# Patient Record
Sex: Male | Born: 1955 | Race: White | Hispanic: Yes | Smoking: Never smoker
Health system: Southern US, Community
[De-identification: ages and names within clinical notes are randomized; demographics above are authoritative.]

## PROBLEM LIST (undated history)

## (undated) DIAGNOSIS — I10 Essential (primary) hypertension: Secondary | ICD-10-CM

## (undated) HISTORY — PX: LEG SURGERY: SHX1003

## (undated) HISTORY — PX: VASCULAR SURGERY: SHX849

## (undated) HISTORY — PX: BACK SURGERY: SHX140

---

## 1998-09-24 ENCOUNTER — Ambulatory Visit (HOSPITAL_COMMUNITY): Admission: RE | Admit: 1998-09-24 | Discharge: 1998-09-24 | Payer: Self-pay | Admitting: Neurosurgery

## 1998-09-24 ENCOUNTER — Encounter: Payer: Self-pay | Admitting: Neurosurgery

## 1998-10-08 ENCOUNTER — Encounter: Payer: Self-pay | Admitting: Neurosurgery

## 1998-10-08 ENCOUNTER — Ambulatory Visit (HOSPITAL_COMMUNITY): Admission: RE | Admit: 1998-10-08 | Discharge: 1998-10-08 | Payer: Self-pay | Admitting: Neurosurgery

## 1998-10-22 ENCOUNTER — Ambulatory Visit (HOSPITAL_COMMUNITY): Admission: RE | Admit: 1998-10-22 | Discharge: 1998-10-22 | Payer: Self-pay | Admitting: Neurosurgery

## 1998-10-22 ENCOUNTER — Encounter: Payer: Self-pay | Admitting: Neurosurgery

## 1998-10-28 ENCOUNTER — Encounter: Payer: Self-pay | Admitting: Emergency Medicine

## 1998-10-28 ENCOUNTER — Emergency Department (HOSPITAL_COMMUNITY): Admission: EM | Admit: 1998-10-28 | Discharge: 1998-10-28 | Payer: Self-pay | Admitting: Emergency Medicine

## 1999-05-11 ENCOUNTER — Encounter: Payer: Self-pay | Admitting: Emergency Medicine

## 1999-05-11 ENCOUNTER — Emergency Department (HOSPITAL_COMMUNITY): Admission: EM | Admit: 1999-05-11 | Discharge: 1999-05-11 | Payer: Self-pay | Admitting: Emergency Medicine

## 1999-05-22 ENCOUNTER — Emergency Department (HOSPITAL_COMMUNITY): Admission: EM | Admit: 1999-05-22 | Discharge: 1999-05-22 | Payer: Self-pay | Admitting: Emergency Medicine

## 2006-02-12 ENCOUNTER — Encounter: Admission: RE | Admit: 2006-02-12 | Discharge: 2006-02-12 | Payer: Self-pay | Admitting: Neurology

## 2006-02-19 ENCOUNTER — Encounter: Admission: RE | Admit: 2006-02-19 | Discharge: 2006-02-19 | Payer: Self-pay | Admitting: Neurology

## 2006-03-01 ENCOUNTER — Encounter: Admission: RE | Admit: 2006-03-01 | Discharge: 2006-05-30 | Payer: Self-pay | Admitting: Neurology

## 2006-04-21 ENCOUNTER — Ambulatory Visit (HOSPITAL_COMMUNITY): Admission: RE | Admit: 2006-04-21 | Discharge: 2006-04-21 | Payer: Self-pay | Admitting: Neurology

## 2006-10-06 ENCOUNTER — Inpatient Hospital Stay (HOSPITAL_COMMUNITY): Admission: EM | Admit: 2006-10-06 | Discharge: 2006-10-07 | Payer: Self-pay | Admitting: Emergency Medicine

## 2006-11-03 ENCOUNTER — Encounter: Admission: RE | Admit: 2006-11-03 | Discharge: 2007-02-01 | Payer: Self-pay | Admitting: Neurology

## 2008-12-11 ENCOUNTER — Ambulatory Visit (HOSPITAL_COMMUNITY): Admission: RE | Admit: 2008-12-11 | Discharge: 2008-12-11 | Payer: Self-pay | Admitting: General Surgery

## 2008-12-11 ENCOUNTER — Encounter (INDEPENDENT_AMBULATORY_CARE_PROVIDER_SITE_OTHER): Payer: Self-pay | Admitting: General Surgery

## 2009-01-12 ENCOUNTER — Emergency Department (HOSPITAL_COMMUNITY): Admission: EM | Admit: 2009-01-12 | Discharge: 2009-01-12 | Payer: Self-pay | Admitting: Family Medicine

## 2009-06-28 ENCOUNTER — Ambulatory Visit (HOSPITAL_COMMUNITY): Admission: RE | Admit: 2009-06-28 | Discharge: 2009-06-28 | Payer: Self-pay | Admitting: Emergency Medicine

## 2009-06-28 ENCOUNTER — Encounter: Payer: Self-pay | Admitting: Emergency Medicine

## 2009-06-28 ENCOUNTER — Ambulatory Visit: Payer: Self-pay | Admitting: *Deleted

## 2010-05-05 ENCOUNTER — Emergency Department (HOSPITAL_COMMUNITY): Admission: EM | Admit: 2010-05-05 | Discharge: 2010-05-05 | Payer: Self-pay | Admitting: Emergency Medicine

## 2010-05-12 ENCOUNTER — Encounter: Admission: RE | Admit: 2010-05-12 | Discharge: 2010-05-12 | Payer: Self-pay | Admitting: Family Medicine

## 2011-02-16 LAB — TROPONIN I: Troponin I: 0.01 ng/mL (ref 0.00–0.06)

## 2011-02-16 LAB — DIFFERENTIAL
Basophils Relative: 1 % (ref 0–1)
Eosinophils Absolute: 0 10*3/uL (ref 0.0–0.7)
Eosinophils Relative: 1 % (ref 0–5)
Monocytes Relative: 9 % (ref 3–12)
Neutrophils Relative %: 52 % (ref 43–77)

## 2011-02-16 LAB — POCT CARDIAC MARKERS: Myoglobin, poc: 155 ng/mL (ref 12–200)

## 2011-02-16 LAB — CBC
MCHC: 34.1 g/dL (ref 30.0–36.0)
MCV: 87.4 fL (ref 78.0–100.0)
Platelets: 126 10*3/uL — ABNORMAL LOW (ref 150–400)

## 2011-02-16 LAB — CK TOTAL AND CKMB (NOT AT ARMC): CK, MB: 4.8 ng/mL — ABNORMAL HIGH (ref 0.3–4.0)

## 2011-02-16 LAB — BASIC METABOLIC PANEL
BUN: 20 mg/dL (ref 6–23)
CO2: 28 mEq/L (ref 19–32)
Chloride: 105 mEq/L (ref 96–112)
Creatinine, Ser: 0.98 mg/dL (ref 0.4–1.5)

## 2011-03-16 LAB — DIFFERENTIAL
Basophils Absolute: 0 10*3/uL (ref 0.0–0.1)
Basophils Relative: 1 % (ref 0–1)
Eosinophils Absolute: 0.1 10*3/uL (ref 0.0–0.7)
Eosinophils Relative: 2 % (ref 0–5)
Monocytes Absolute: 0.4 10*3/uL (ref 0.1–1.0)
Monocytes Relative: 10 % (ref 3–12)
Neutro Abs: 2.3 10*3/uL (ref 1.7–7.7)

## 2011-03-16 LAB — CBC
MCV: 86.9 fL (ref 78.0–100.0)
RBC: 5.16 MIL/uL (ref 4.22–5.81)
WBC: 4.3 10*3/uL (ref 4.0–10.5)

## 2011-03-16 LAB — COMPREHENSIVE METABOLIC PANEL
ALT: 89 U/L — ABNORMAL HIGH (ref 0–53)
AST: 52 U/L — ABNORMAL HIGH (ref 0–37)
Albumin: 4.9 g/dL (ref 3.5–5.2)
Alkaline Phosphatase: 135 U/L — ABNORMAL HIGH (ref 39–117)
CO2: 30 mEq/L (ref 19–32)
Chloride: 105 mEq/L (ref 96–112)
Creatinine, Ser: 1.03 mg/dL (ref 0.4–1.5)
GFR calc Af Amer: >60 mL/min (ref >60)
GFR calc non Af Amer: >60 mL/min (ref >60)
Potassium: 4.6 mEq/L (ref 3.5–5.1)
Total Bilirubin: 1.3 mg/dL — ABNORMAL HIGH (ref 0.3–1.2)

## 2011-04-08 ENCOUNTER — Emergency Department (HOSPITAL_COMMUNITY)
Admission: EM | Admit: 2011-04-08 | Discharge: 2011-04-08 | Disposition: A | Discharge status: Home / Self Care | Payer: PRIVATE HEALTH INSURANCE | Attending: Emergency Medicine | Admitting: Emergency Medicine

## 2011-04-08 DIAGNOSIS — M79609 Pain in unspecified limb: Secondary | ICD-10-CM

## 2011-04-08 DIAGNOSIS — Z79899 Other long term (current) drug therapy: Secondary | ICD-10-CM | POA: Insufficient documentation

## 2011-04-08 DIAGNOSIS — R209 Unspecified disturbances of skin sensation: Secondary | ICD-10-CM | POA: Insufficient documentation

## 2011-04-08 DIAGNOSIS — M545 Low back pain, unspecified: Secondary | ICD-10-CM | POA: Insufficient documentation

## 2011-04-08 DIAGNOSIS — M5430 Sciatica, unspecified side: Secondary | ICD-10-CM | POA: Insufficient documentation

## 2011-04-08 DIAGNOSIS — K219 Gastro-esophageal reflux disease without esophagitis: Secondary | ICD-10-CM | POA: Insufficient documentation

## 2011-04-08 DIAGNOSIS — I10 Essential (primary) hypertension: Secondary | ICD-10-CM | POA: Insufficient documentation

## 2011-04-14 NOTE — Op Note (Signed)
NAMEJOAOVICTOR, KRONE              ACCOUNT NO.:  [id number]   MEDICAL RECORD NO.:  [id number]          PATIENT TYPE:  AMB   LOCATION:  SDS                          FACILITY:  [facility]   PHYSICIAN:  Cherylynn Ridges, M.D.    DATE OF BIRTH:  27-Jan-1956   DATE OF PROCEDURE:  12/11/2008  DATE OF DISCHARGE:                               OPERATIVE REPORT   PREOPERATIVE DIAGNOSES:  Anterior anal polyp with internal and external  hemorrhoids.   POSTOPERATIVE DIAGNOSES:  Anterior anal polyp with internal and external  hemorrhoids.   PROCEDURE.:  1. Exam under anesthesia.  2. Hemorrhoidectomy x2.  3. Excision of internal anal polyp.   SURGEON:  Cherylynn Ridges, MD   ANESTHESIA:  General with laryngeal airway.   ESTIMATED BLOOD LOSS:  Less than 30 mL.   COMPLICATIONS:  None.   CONDITION:  Stable.   FINDINGS:  The patient had a 1-cm pedunculated anterior anal polyp  associated with hemorrhoid complex at the same area.  He had also a  large hemorrhoidal complex at the 7 o'clock position associated with  internal and external hemorrhoids.   OPERATION:  The patient was taken to the operating room and placed on  the table in supine position.  After an adequate general laryngeal  airway anesthetic was administered, he was placed in lithotomy position  and prepped and draped in usual sterile manner.   We used an anal speculum in order to examine the anal and distal rectal  area.  The patient had large hemorrhoidal complexes at the 7 o'clock  position and an anterior polyp at the 12 o'clock position.  We excised  the internal and external hemorrhoids at the 7 o'clock position  initially, placing Allis clamps along the distal and the deep portion of  the hemorrhoidal complex.  We placed a chromic stitch at the base of the  hemorrhoid.  Then, we excised the hemorrhoid using electrocautery,  providing hemostasis along the way.  We used the chromic to close the  mucosa and most of the skin, even a  small opening using a running  locking stitch with 3-0 chromic.  We then adjusted the speculum to  expose the anterior portion of the anorectal area.  It was then that we  saw the anorectal polyp.  We placed Allis clamp at the base of the polyp  and also at the base of the hemorrhoid and I used several 3-0 chromic in  order to reappose the mucosa after we excised the polyp in the  hemorrhoid.  Bleeding was well controlled at the  conclusion of the case.  We did close the mucosa in the same manner  using a running locking stitch of the 3-0 chromic.  All counts were  correct.  The Gelfoam covered with Dibucaine ointment was placed into  the anal vault.  Dressings were applied with 4 x 4s and ABD.  All counts  were correct.      Cherylynn Ridges, M.D.  Electronically Signed     JOW/MEDQ  D:  12/11/2008  T:  12/11/2008  Job:  [id number]  cc:   Oley Balm. Georgina Pillion, M.D.

## 2011-04-17 NOTE — Consult Note (Signed)
NAMEJAWAAN, ADACHI NO.:  [id number]   MEDICAL RECORD NO.:  [id number]          PATIENT TYPE:  INP   LOCATION:  5502                         FACILITY:  [facility]   PHYSICIAN:  Bevelyn Buckles. Champey, M.D.DATE OF BIRTH:  1956/07/27   DATE OF CONSULTATION:  DATE OF DISCHARGE:                                   CONSULTATION   REASON FOR VISIT:  Dizziness/vertigo.   HPI:  Mr. Schauf is a 55 year old Caucasian male with a past medical history  of hypertension, presents with a few day history of intermittent severe  dizziness and vertigo.  The patient symptoms have been gradually improving  over time and are mostly triggered with quick turns of the head from side to  side.  Originally complaining of some nausea; however, this is not present  currently.  He also had a slight headache, which is also better as well.  He  does have a history of prior similar episodes in the past, for which he is  followed by Dr. Anne Hahn and had an extensive workup, which was negative for  MS.  He was diagnosed with an inner ear disturbance.  He denies any history  of numbness, focal weakness, vision changes, speech changes, swallowing  problems, chewing problems, falls or loss of consciousness.   PAST MEDICAL HISTORY:  Positive for hypertension.   CURRENT MEDICATIONS:  Include atenolol and aspirin.   ALLERGIES:  PATIENT HAS DRUG ALLERGIES TO SULFA.   FAMILY HISTORY:  Positive for MS and diabetes.   SOCIAL HISTORY:  The patient lives with his wife and kids.  Denies any  smoking and socially drinks alcohol.   REVIEW OF SYSTEMS:  Positive as per HPI.  Review of systems negative as per  HPI in greater then 8 other organ systems.   EXAMINATION:  VITAL SIGNS:  Temperature is 98.6.  Pulse is 55.  Respirations  18.  Blood pressure is 133/88.  O2 saturation is 97%.  HEENT:  Normocephalic, atraumatic.  Extraocular muscles are intact.  Pupils  equal, round and reactive to light.  NECK:  Supple.  No  carotid bruits.  HEART:  Regular.  LUNGS:  Clear.  ABDOMEN:  Soft and nontender.  EXTREMITIES:  Show good pulses with no edema.  NEUROLOGICAL EXAMINATION:  Patient is awake, alert and oriented x3.  Language is fluent.  Memory and knowledge are within normal limits.  Cranial  nerves II-XII are grossly intact.  Motor examination shows 5/5 strength and  normal tone in all 4 extremities.  No drifts are noted.  Sensory examination  is within normal limits.  Light touch reflexes are 1 to 2+ throughout.  Toes  are neutral bilaterally.  Cerebral function is within normal limits finger-  to-nose.  Gait is not assessed secondary to safety.   MRI/MRA of the head showed some white matter disease, more extensive in the  left posterior insula and centrum semiovale, which is unchanged since March  of 2007.  MRA was normal.   LABS:  WBC 7.9, hemoglobin 14.2, hematocrit is 41.1, platelets 150.  Sodium  is 135, potassium is 4.0, chloride is  107, CO2 is 23, BUN 25, creatinine  1.2, glucose 157.  CSF reported in May showed a WBC of 2.0, glucose of 51,  protein 31, Bands negative.   IMPRESSION:  This is a 55 year old Caucasian male with intermittent vertigo,  workup in the past has been negative, except for a small vessel disease,  more prominent in the left hemisphere.  His symptoms are reproducible on  examination.  Hallpike maneuvering producing some slight symptoms.  This is  suggestive possibly of BPV or benign positional vertigo.  His symptoms have  also been improving with time and meclizine.  MRI was reviewed and showed  stable white matter disease and no acute lesions.  The patient had extensive  workup in the past by Dr. Anne Hahn.  I would recommend continuing the  meclizine t.i.d. and also daily aspirin.  I would consider getting an EEG in  the future if warranted.  I will get PT/OT consults as well.  I will follow  the patient while he is in the hospital.      Bevelyn Buckles. Nash Shearer, M.D.   Electronically Signed     DRC/MEDQ  D:  10/06/2006  T:  10/07/2006  Job:  [id number]

## 2011-04-17 NOTE — Op Note (Signed)
NAMEKINNICK, MAUS NO.:  [id number]   MEDICAL RECORD NO.:  [id number]          PATIENT TYPE:  OUT   LOCATION:  MDC                          FACILITY:  [facility]   PHYSICIAN:  Marlan Palau, M.D.  DATE OF BIRTH:  09-25-1956   DATE OF PROCEDURE:  04/21/2006  DATE OF DISCHARGE:                                 OPERATIVE REPORT   PROCEDURE PERFORMED:  Lumbar puncture.   HISTORY:  This 55 year old gentleman who his being evaluated for abnormal  MRI scan of the brain, episodes of dizziness, gait disturbance, headache.  Patient is being evaluated for demyelinating disease or vasculitis.   Lumbar puncture was performed with the patient in the fetal position on the  right side.  The low back was cleaned with Betadine solution and  approximately 3 mL of 1% Xylocaine was used as local anesthetic.  Lumbar  puncture was performed using a 20 gauge spinal needle.  Initial procedure  was attempted at the L4-5 interspace unsuccessfully, went to the L3-4  interspace and spinal fluid was obtained.  Opening pressure was 70 mmHg.  The patient had clear colorless spinal fluid.  18 mL was removed for  testing.  Tube #1 was sent for VDRL, cryptococcal antigen, angiotensin  converting enzyme level.  Tube #2 was sent for oligoclonal banding, IgG  albumin ratio.  Tube #3 was sent for cell differential, glucose, protein.  Tube #4 was sent for Lyme's antibody panel.  Blood work was sent for ANA,  rheumatoid factor, sed rate, antiphospholipid antibody panel, factor V  Leiden, protein S, protein C, antithrombin 3 level.  The patient tolerated  the procedure fairly well and no complications were noted.      Marlan Palau, M.D.  Electronically Signed     CKW/MEDQ  D:  04/21/2006  T:  04/21/2006  Job:  [id number]

## 2011-04-17 NOTE — H&P (Signed)
NAME:  Kevin Travis, GOSLING NO.:  [id number]   MEDICAL RECORD NO.:  [id number]          PATIENT TYPE:  INP   LOCATION:  1827                         FACILITY:  [facility]   PHYSICIAN:  Lucita Ferrara, MD         DATE OF BIRTH:  [birth year]   DATE OF ADMISSION:  10/05/2006  DATE OF DISCHARGE:                                HISTORY & PHYSICAL   HISTORY OF PRESENT ILLNESS:  The patient is a 55 year old male with a past  medical history significant for dizziness and vertigo, who presents to the  emergency room with dizziness that has been intractable for the last 2-3  hours, accompanied by nausea and vomiting.  The patient denies any abdominal  pain, denies any chest pain or diaphoresis, denies any focal neurologic  deficits.  There is no numbness or tingling.  Review of systems is otherwise  negative.  Past medical history is significant for a history of dizziness  for which he has had a pretty extensive workup including a lumbar tap for  which he has had VDRL and cryptococcal antigen sent off; most of the workup  has been mostly negative.   PAST SURGICAL HISTORY:  None.   PAST MEDICAL HISTORY:  As noted above, including hypertension that is well-  controlled with atenolol.   ALLERGIES:  The patient is allergic to SULFA MEDICATIONS.   MEDICATIONS:  The patient is taking atenolol 25 mg p.o. b.i.d.   PHYSICAL EXAMINATION:  GENERAL:  Generally speaking, the patient is in no  acute distress.  VITALS:  Temperature 96.3, blood pressure 106/68, pulse 57, respirations 18,  Pulse oximetry 96% on room air.  HEENT:  Normocephalic, atraumatic.  Sclerae anicteric.  NECK:  No JVD.  No carotid bruits.  Neck supple.  No thyromegaly.  CARDIOVASCULAR:  S1 and S2.  Regular rate and rhythm.  No murmurs, rubs or  clicks.  ABDOMEN:  Soft, nontender and non-distended.  Positive bowel sounds.  LUNGS:  Clear to auscultation bilaterally.  No wheezes.  ABDOMEN:  Soft, nontender and non-distended.   Positive bowel sounds.  No  bruits.  EXTREMITIES:  No clubbing, cyanosis, or edema.  NEUROLOGIC:  The patient is alert and oriented x3.  Cranial nerves II-XII  though grossly intact.  PERIPHERAL VASCULAR:  Pulses 2+ bilaterally, upper and lower extremities,  intact.   IMAGING STUDY:  CT of the head is negative for any acute intracranial  process.   LABORATORY DATA:  Electrolytes negative, sodium 135, potassium 4, chloride  107, BUN 25.  White count is 7.9, hemoglobin 14, hematocrit 41, platelets  150,000.   EMERGENCY DEPARTMENT COURSE:  In the emergency room, the patient was given  Zofran; he responded well.  The patient is sleeping comfortably.   ASSESSMENT AND PLAN:  The patient is a 55 year old male here with  intractable nausea, vomiting and dizziness.  We will go ahead and admit him  to the telemetry unit and complete a transient ischemic attack workup.  I  think the patient would benefit from having MRA/MRI of the brain to assess  the vertebrobasilar system.  We will go ahead and continue on aspirin and  atenolol.  The patient currently is hemodynamically stable.  The patient's  hospital course and details were explained and the patient understands.      Lucita Ferrara, MD  Electronically Signed     RR/MEDQ  D:  10/06/2006  T:  10/06/2006  Job:  [id number]   cc:   Oley Balm. Georgina Pillion, M.D.

## 2011-12-08 ENCOUNTER — Ambulatory Visit (HOSPITAL_COMMUNITY)
Admission: RE | Admit: 2011-12-08 | Discharge: 2011-12-08 | Disposition: A | Discharge status: Home / Self Care | Payer: Worker's Compensation | Source: Ambulatory Visit | Attending: Preventative Medicine | Admitting: Preventative Medicine

## 2011-12-08 DIAGNOSIS — M545 Low back pain, unspecified: Secondary | ICD-10-CM | POA: Insufficient documentation

## 2011-12-08 DIAGNOSIS — M6281 Muscle weakness (generalized): Secondary | ICD-10-CM | POA: Insufficient documentation

## 2011-12-08 DIAGNOSIS — R262 Difficulty in walking, not elsewhere classified: Secondary | ICD-10-CM | POA: Insufficient documentation

## 2011-12-08 DIAGNOSIS — IMO0001 Reserved for inherently not codable concepts without codable children: Secondary | ICD-10-CM | POA: Insufficient documentation

## 2011-12-08 NOTE — Progress Notes (Signed)
Physical Therapy Evaluation  Patient Details  Name: Kevin Travis MRN: [id number] Date of Birth: December 18, 1955  Today's Date: 12/08/2011 Time: 6578-4696 Time Calculation (min): 65 min  Visit#: 1  of 6   Re-eval: 12/17/11 Assessment Diagnosis: back pain Surgical Date: 04/11/11 Next MD Visit: 12/17/2011  Past Medical History: No past medical history on file. Past Surgical History: No past surgical history on file.  Subjective Symptoms/Limitations Symptoms: Pt states that he had a disectomy in May of this year.  He did not have any therapy following his surgery.  He states that the surgery did very well he was basically pain free.  The patient states that he was walking down a grass slope  Nov. 29th. and both his feet went out from underneath him and he landed on his bottom.   The patient states that he did not feel any back pain for a few days but then it showed up and he told he supervisor.  His pain is central and ist is aggrevated by sitting.  The patient has been referred to therapy to decrease his symptoms of pain.    How long can you sit comfortably?: The patient states that she is able to sit for two hours before he has an aching feeling and a feeling of shocks on both sides of his back. How long can you stand comfortably?: The patient states that he is able to stand for prolong period of time. How long can you walk comfortably?: The patient states that walking is no problem. Special Tests: The patient states he currently is doing knee to chest , hamstring stretches and pelvic tilt. Pain Assessment Currently in Pain?: Yes Pain Score:   2 (past week worst has been a 5; best 0) Pain Location: Back Pain Orientation: Lower Pain Type: Chronic pain Pain Onset: More than a month ago Pain Frequency: Intermittent Pain Relieving Factors: walking  Assessment RLE Strength Right Hip Flexion: 5/5 Right Hip Extension: 5/5 Right Hip ABduction: 5/5 Right Hip ADduction: 5/5 Right Knee  Flexion: 5/5 Right Knee Extension: 5/5 Right Ankle Dorsiflexion: 5/5 LLE Strength Left Hip Flexion: 5/5 Left Hip Extension: 5/5 Left Hip ABduction: 5/5 Left Hip ADduction: 5/5 Left Knee Flexion: 5/5 Left Knee Extension: 5/5 Left Ankle Dorsiflexion: 5/5 Lumbar AROM Lumbar Flexion: decreased 30 witth increase of sx Lumbar Extension:  (wnl) Lumbar - Right Side Bend: wnl Lumbar - Left Side Bend: wnl Lumbar - Right Rotation: wnl Lumbar - Left Rotation: wnl  Exercise/Treatments Mobility/Balance  Posture/Postural Control Posture/Postural Control:  (Pt. R  PSIS is high; R iliac crest is high; R ASIS is low )   Stretches Active Hamstring Stretch: 3 reps;30 seconds Single Knee to Chest Stretch: 3 reps;30 seconds Lumbar Exercises   Stability Bridge: 10 reps Bent Knee Raise: 10 reps Ab Set: 5 reps   Modalities Modalities: Ultrasound Manual Therapy Manual Therapy: Joint mobilization Joint Mobilization: For SI joint Ultrasound Ultrasound Location: To Low back R SI Ultrasound Parameters: 1.2 w/cm2 using the large head  Ultrasound Goals: Pain  Physical Therapy Assessment and Plan PT Assessment and Plan Clinical Impression Statement: Pt with tight mm; SI dysfunction who will benefit from skilled PT to maximize functional potential and become pain free. Rehab Potential: Good PT Frequency: Min 3X/week PT Duration:  (2 weeks) PT Treatment/Interventions: Therapeutic activities;Other (comment) (modalities and manual techniques for pain) PT Plan: see patient three times a week for two weeks to  decrease pain and return pt to previous functional level.    Goals  Home Exercise Program Pt will Perform Home Exercise Program: Independently PT Short Term Goals PT Short Term Goal 1: Pt pain to decrease by 75% PT Short Term Goal 2: Pt to state he is able to sit for two hours without any increased pain  Problem List There is no problem list on file for this patient.   PT - End of  Session Activity Tolerance: Patient tolerated treatment well General Behavior During Session: [facility] for tasks performed Cognition: [facility] for tasks performed PT Plan of Care PT Home Exercise Plan: given Consulted and Agree with Plan of Care: Patient   RUSSELL,CINDY 12/08/2011, 12:02 PM  Physician Documentation Your signature is required to indicate approval of the treatment plan as stated above.  Please sign and either send electronically or make a copy of this report for your files and return this physician signed original.   Please mark one 1.__approve of plan  2. ___approve of plan with the following conditions.   ______________________________                                                          _____________________ Physician Signature                                                                                                             Date

## 2011-12-08 NOTE — Patient Instructions (Addendum)
HEP; use a towel roll or lumbar support.

## 2011-12-09 ENCOUNTER — Ambulatory Visit (HOSPITAL_COMMUNITY)
Admission: RE | Admit: 2011-12-09 | Discharge: 2011-12-09 | Disposition: A | Discharge status: Home / Self Care | Payer: Worker's Compensation | Source: Ambulatory Visit | Attending: Preventative Medicine | Admitting: Preventative Medicine

## 2011-12-09 NOTE — Progress Notes (Signed)
Physical Therapy Treatment Patient Details  Name: Kevin Travis MRN: [id number] Date of Birth: Jan 15, 1956  Today's Date: 12/09/2011 Time: 4098-1191 Time Calculation (min): 45 min Visit#: 2  of 6   Re-eval: 12/17/11 Charges: Therex x 34' [place] x 8'   Subjective: Symptoms/Limitations Symptoms: Pt reports HEP compliance. Pain Assessment Currently in Pain?: Yes Pain Score:   1 Pain Location: Back Pain Orientation: Lower Pain Type: Chronic pain   Exercise/Treatments  Stretches Active Hamstring Stretch: 3 reps;30 seconds Single Knee to Chest Stretch: 3 reps;30 seconds Stability Dead Bug: 10 reps Bridge: 10 reps Bent Knee Raise: 10 reps Ab Set: 10 reps Single Arm Raise: 10 reps Leg Raise: 10 reps Opposite Arm/Leg Raise: 10 reps  Modalities Modalities: Ultrasound Ultrasound Ultrasound Location: To low back; B SI Ultrasound Parameters: 1.2 W/cm2 1 MHz  Physical Therapy Assessment and Plan PT Assessment and Plan Clinical Impression Statement: Pt completes therex with good form and control. Began new exercises without difficulty. Pt reports no increase in pain at end of session.Pt reports decreased tightness in low back at end of session. SI allignment checked, MET not needed this session. PT Plan: Continue to progress per PT POC.     Problem List There is no problem list on file for this patient.   PT - End of Session Activity Tolerance: Patient tolerated treatment well General Behavior During Session: [facility] for tasks performed Cognition: [facility] for tasks performed  Antonieta Iba 12/09/2011, 9:47 AM

## 2011-12-10 ENCOUNTER — Ambulatory Visit (HOSPITAL_COMMUNITY)
Admission: RE | Admit: 2011-12-10 | Discharge: 2011-12-10 | Disposition: A | Discharge status: Home / Self Care | Payer: Worker's Compensation | Source: Ambulatory Visit | Attending: Preventative Medicine | Admitting: Preventative Medicine

## 2011-12-10 NOTE — Progress Notes (Signed)
Physical Therapy Treatment Patient Details  Name: Kevin Travis MRN: [id number] Date of Birth: 1956-03-12  Today's Date: 12/10/2011 Time: 7829-5621 Time Calculation (min): 39 min Visit#: 3  of 6   Re-eval: 12/17/11 Charges: therex 30', ultrasound 8'    Subjective: Symptoms/Limitations Symptoms: Pt. reports pain is in central LB 1/10. Pain Assessment Currently in Pain?: Yes Pain Score:   1 Pain Location: Back Pain Orientation: Lower Pain Type: Chronic pain   Exercise/Treatments Stretches Active Hamstring Stretch: 3 reps;30 seconds Single Knee to Chest Stretch: 3 reps;30 seconds Prone on Elbows Stretch: Limitations Prone on Elbows Stretch Limitations: 2 minutes Stability Dead Bug: 10 reps Bridge: 15 reps Bent Knee Raise: 15 reps Ab Set: 15 reps Single Arm Raise: 10 reps Leg Raise: 10 reps Opposite Arm/Leg Raise: 10 reps   Modalities Ultrasound in Right sidelying Ultrasound Location: B SI Ultrasound Parameters: 1.4 w/cm2 with 1 MHz, 4'each side, 8'total Ultrasound Goals: Pain  Physical Therapy Assessment and Plan PT Assessment and Plan Clinical Impression Statement: Pt. able to perform all exercises correctly with good recall.  Progressing well with pain reduction.     PT - End of Session Activity Tolerance: Patient tolerated treatment well General Behavior During Session: [facility] for tasks performed Cognition: [facility] for tasks performed PT Plan of Care PT Home Exercise Plan: Continue per POC.  Amy B. Bascom Levels, PTA 12/10/2011, 3:19 PM

## 2011-12-14 ENCOUNTER — Ambulatory Visit (HOSPITAL_COMMUNITY)
Admission: RE | Admit: 2011-12-14 | Discharge: 2011-12-14 | Disposition: A | Discharge status: Home / Self Care | Payer: Worker's Compensation | Source: Ambulatory Visit | Attending: Preventative Medicine | Admitting: Preventative Medicine

## 2011-12-14 NOTE — Progress Notes (Signed)
Physical Therapy Treatment Patient Details  Name: Kevin Travis MRN: [id number] Date of Birth: [birth year]  Today's Date: 12/14/2011 Time: 5366-4403 Time Calculation (min): 42 min Visit#: 4  of 6   Re-eval: 12/17/11 Charges: Therex x 30' [place] x 8'  Subjective: Symptoms/Limitations Symptoms: Pt describes pain as a small aggrivation. He rates it at not quite a 1/10.  Pain Assessment Pain Score:  (.5/10) Pain Location: Back Pain Orientation: Lower   Exercise/Treatments Stretches Active Hamstring Stretch: 3 reps;30 seconds Single Knee to Chest Stretch: 3 reps;30 seconds Prone on Elbows Stretch: Limitations Prone on Elbows Stretch Limitations: 3 minutes Stability Dead Bug: 15 reps Bridge: 15 reps Isometric Hip Flexion: 10 reps Large Ball Abdominal Isometric: 10 reps Large Ball Oblique Isometric: 10 reps Single Arm Raise: 15 reps Leg Raise: 15 reps Opposite Arm/Leg Raise: 15 reps  Modalities Modalities: Ultrasound Ultrasound Ultrasound Location: L SI Ultrasound Parameters: 1.4 w/cm2 with 1 MHz x8'  Physical Therapy Assessment and Plan PT Assessment and Plan Clinical Impression Statement: Pt completes exercises with good control and form. Began abdominal isometrics with physioball to improve core strength. Pt completes new exercises w/o difficulty.  [place] focused more on L side SI this session secondary to more pain in that area. Pt reports 0/10 pain at end of session. PT Plan: Continue to progress per PT POC. Begin toe taps next session to increase core strength.     Problem List There is no problem list on file for this patient.   PT - End of Session Activity Tolerance: Patient tolerated treatment well General Behavior During Session: [facility] for tasks performed Cognition: [facility] for tasks performed  Antonieta Iba 12/14/2011, 1:53 PM

## 2011-12-15 ENCOUNTER — Ambulatory Visit (HOSPITAL_COMMUNITY)
Admission: RE | Admit: 2011-12-15 | Discharge: 2011-12-15 | Disposition: A | Discharge status: Home / Self Care | Payer: Worker's Compensation | Source: Ambulatory Visit | Attending: Preventative Medicine | Admitting: Preventative Medicine

## 2011-12-15 NOTE — Progress Notes (Signed)
Physical Therapy Treatment Patient Details  Name: Kevin Travis MRN: [id number] Date of Birth: Apr 11, 1956  Today's Date: 12/15/2011 Time: 2725-3664 Time Calculation (min): 45 min Visit#: 5  of 6   Re-eval: 12/07/11 Charges: Therex x 35' [place] x 8'  Subjective: Symptoms/Limitations Symptoms: Pt states that he continues to improve. Pain Assessment Currently in Pain?: Yes Pain Score:  (.2) Pain Orientation: Right Pain Type: Chronic pain   Exercise/Treatments Stretches Active Hamstring Stretch: 3 reps;30 seconds Single Knee to Chest Stretch: 3 reps;30 seconds Lower Trunk Rotation: 5 reps Standing Extension: Limitations Standing Extension Limitations: Lumbar ext supine on pball x 10 Stability Dead Bug: Limitations Dead Bug Limitations: Opp arm/leg march seated on pball x 10 Bridge: 15 reps Ab Set: Limitations AB Set Limitations: Pelvic tilts A/P R/L x 10 each on pball Toe Tap: 10 reps Large Ball Abdominal Isometric: 10 reps Large Ball Oblique Isometric: 10 reps Straight Leg Raise: Other (comment) Hip Abduction: Limitations Hip Abduction Limitations: LAQ seated on pball x 10 Plank: LE on pball x 1'  Modalities Modalities: Ultrasound Ultrasound Ultrasound Location: L SI Ultrasound Parameters: 1.4 w/cm2 with 1 MHz x8' (Completed by Nicholes Rough, PTT at end of session)  Physical Therapy Assessment and Plan PT Assessment and Plan Clinical Impression Statement: Tx focus on pball exercises secondary pt having pball at home. Pt completes exercises with good form and minimal need for cueing. [place] continued to keep decreasing pain. PT Plan: Reassess next session.     Problem List There is no problem list on file for this patient.   PT - End of Session Activity Tolerance: Patient tolerated treatment well General Behavior During Session: [facility] for tasks performed Cognition: [facility] for tasks performed  Antonieta Iba 12/15/2011, 10:25 AM

## 2011-12-16 ENCOUNTER — Ambulatory Visit (HOSPITAL_COMMUNITY)
Admission: RE | Admit: 2011-12-16 | Discharge: 2011-12-16 | Disposition: A | Discharge status: Home / Self Care | Payer: Worker's Compensation | Source: Ambulatory Visit | Attending: Preventative Medicine | Admitting: Preventative Medicine

## 2011-12-16 NOTE — Progress Notes (Signed)
Physical Therapy Treatment Patient Details  Name: Kevin Travis MRN: [id number] Date of Birth: [birth year]  Today's Date: 12/16/2011 Time: 7829-5621 Time Calculation (min): 46 min Visit#: 6  of 6   Re-eval:  12/16/2011  there ex 1; ed 1; [place] 1  Subjective: Symptoms/Limitations Symptoms: PT states he has a little increase pain from the ball exercises. Pain Assessment Currently in Pain?: Yes Pain Score:   1 Pain Location: Back Pain Orientation: Right Pain Type: Chronic pain    Exercise/Treatments     Stretches Active Hamstring Stretch: 3 reps;30 seconds Single Knee to Chest Stretch: 3 reps;30 seconds   Stability Large Ball Abdominal Isometric: Limitations Large Ball Abdominal Isometric Limitations: sitting on ball LAQ, Hip flex, dead bug x 10 Plank: on ball x 2 '   Modalities Modalities: Ultrasound Ultrasound Ultrasound Location: Lumbar paraspinal mm Ultrasound Parameters: 1.5 w/cm2 x 8 min. Ultrasound Goals: Pain  Physical Therapy Assessment and Plan PT Assessment and Plan Clinical Impression Statement: Pt improved significantly.  Pt to return to MD on Monday anticipate D/C from therapy Rehab Potential: Good PT Plan: Discharge patient.    Goals PT Short Term Goals PT Short Term Goal 1 - Progress: Met PT Short Term Goal 2 - Progress: Met  Problem List There is no problem list on file for this patient.   PT - End of Session Activity Tolerance: Patient tolerated treatment well General Behavior During Session: [facility] for tasks performed Cognition: [facility] for tasks performed  Lundy Cozart,CINDY 12/16/2011, 11:37 AM

## 2011-12-17 ENCOUNTER — Ambulatory Visit (HOSPITAL_COMMUNITY): Payer: PRIVATE HEALTH INSURANCE

## 2012-04-27 ENCOUNTER — Emergency Department (HOSPITAL_COMMUNITY)
Admission: EM | Admit: 2012-04-27 | Discharge: 2012-04-27 | Disposition: A | Discharge status: Home / Self Care | Payer: PRIVATE HEALTH INSURANCE | Source: Home / Self Care | Attending: Emergency Medicine | Admitting: Emergency Medicine

## 2012-04-27 ENCOUNTER — Encounter (HOSPITAL_COMMUNITY): Payer: Self-pay | Admitting: Emergency Medicine

## 2012-04-27 DIAGNOSIS — M549 Dorsalgia, unspecified: Secondary | ICD-10-CM

## 2012-04-27 DIAGNOSIS — M79609 Pain in unspecified limb: Secondary | ICD-10-CM

## 2012-04-27 DIAGNOSIS — M79603 Pain in arm, unspecified: Secondary | ICD-10-CM

## 2012-04-27 HISTORY — DX: Essential (primary) hypertension: I10

## 2012-04-27 MED ORDER — IBUPROFEN 800 MG PO TABS: 800.0000 mg | ORAL_TABLET | Freq: Three times a day (TID) | ORAL | Status: AC

## 2012-04-27 MED ORDER — HYDROCODONE-ACETAMINOPHEN 5-325 MG PO TABS: 2.0000 | ORAL_TABLET | ORAL | Status: AC | PRN

## 2012-04-27 MED ORDER — CYCLOBENZAPRINE HCL 10 MG PO TABS: 10.0000 mg | ORAL_TABLET | Freq: Two times a day (BID) | ORAL | Status: AC | PRN

## 2012-04-27 NOTE — ED Provider Notes (Signed)
History     CSN: [id number]  Arrival date & time 04/27/12  0801   First MD Initiated Contact with Patient 04/27/12 [phone]      Chief Complaint  Patient presents with  . Back Pain    (Consider location/radiation/quality/duration/timing/severity/associated sxs/prior treatment) Patient is a 56 y.o. male presenting with back pain. The history is provided by the patient. No language interpreter was used.  Back Pain  This is a new problem. The current episode started more than 2 days ago. The problem has been gradually worsening. The pain is associated with no known injury. The pain is present in the lumbar spine. The quality of the pain is described as aching. The pain does not radiate. The pain is at a severity of 6/10. The pain is moderate. The symptoms are aggravated by certain positions. The pain is the same all the time. Stiffness is present in the morning and all day. Pertinent negatives include no paresis, no tingling and no weakness. He has tried nothing for the symptoms. The treatment provided mild relief.    No past medical history on file.  No past surgical history on file.  No family history on file.  History  Substance Use Topics  . Smoking status: Not on file  . Smokeless tobacco: Not on file  . Alcohol Use: Not on file      Review of Systems  Musculoskeletal: Positive for back pain.  Neurological: Negative for tingling and weakness.  All other systems reviewed and are negative.    Allergies  Sulfa antibiotics  Home Medications  No current outpatient prescriptions on file.  BP 128/70  Pulse 64  Temp(Src) 97.7 F (36.5 C) (Oral)  Resp 18  SpO2 97%  Physical Exam  Nursing note and vitals reviewed. Constitutional: He is oriented to person, place, and time. He appears well-developed and well-nourished.  HENT:  Head: Normocephalic and atraumatic.  Right Ear: External ear normal.  Left Ear: External ear normal.  Nose: Nose normal.  Mouth/Throat:  Oropharynx is clear and moist.  Neck: Normal range of motion. Neck supple.  Cardiovascular: Normal rate and normal heart sounds.   Pulmonary/Chest: Effort normal and breath sounds normal.  Abdominal: Soft.  Musculoskeletal: He exhibits edema and tenderness.       Tender ls spine, from  Left elbow no deformity,  From,  Ns and nv intact  Neurological: He is alert and oriented to person, place, and time. He has normal reflexes.  Skin: Skin is warm.  Psychiatric: He has a normal mood and affect.    ED Course  Procedures (including critical care time)  Labs Reviewed - No data to display No results found.   No diagnosis found.    MDM  Ibuprofen. Flexeril and hydrocodone.          Lonia Skinner [place], [place] 04/27/12 [phone]

## 2012-04-27 NOTE — Discharge Instructions (Signed)
Back Exercises Back exercises help treat and prevent back injuries. The goal of back exercises is to increase the strength of your abdominal and back muscles and the flexibility of your back. These exercises should be started when you no longer have back pain. Back exercises include:  Pelvic Tilt. Lie on your back with your knees bent. Tilt your pelvis until the lower part of your back is against the floor. Hold this position 5 to 10 sec and repeat 5 to 10 times.   Knee to Chest. Pull first 1 knee up against your chest and hold for 20 to 30 seconds, repeat this with the other knee, and then both knees. This may be done with the other leg straight or bent, whichever feels better.   Sit-Ups or Curl-Ups. Bend your knees 90 degrees. Start with tilting your pelvis, and do a partial, slow sit-up, lifting your trunk only 30 to 45 degrees off the floor. Take at least 2 to 3 seconds for each sit-up. Do not do sit-ups with your knees out straight. If partial sit-ups are difficult, simply do the above but with only tightening your abdominal muscles and holding it as directed.   Hip-Lift. Lie on your back with your knees flexed 90 degrees. Push down with your feet and shoulders as you raise your hips a couple inches off the floor; hold for 10 seconds, repeat 5 to 10 times.   Back arches. Lie on your stomach, propping yourself up on bent elbows. Slowly press on your hands, causing an arch in your low back. Repeat 3 to 5 times. Any initial stiffness and discomfort should lessen with repetition over time.   Shoulder-Lifts. Lie face down with arms beside your body. Keep hips and torso pressed to floor as you slowly lift your head and shoulders off the floor.  Do not overdo your exercises, especially in the beginning. Exercises may cause you some mild back discomfort which lasts for a few minutes; however, if the pain is more severe, or lasts for more than 15 minutes, do not continue exercises until you see your  caregiver. Improvement with exercise therapy for back problems is slow.  See your caregivers for assistance with developing a proper back exercise program. Document Released: 12/24/2004 Document Revised: 11/05/2011 Document Reviewed: 11/16/2005 [employer] Patient Information 2012 [employer]. 

## 2012-04-27 NOTE — ED Notes (Signed)
Pt having lower back pain for 2 days. He is not aware of an injury but had back surgery about 1 year ago. Aleve tried without improvement. Pt also has left shoulder pain that is new.

## 2012-04-28 NOTE — ED Provider Notes (Signed)
Diagnosis: Back pain, arm pain.  Medical screening examination/treatment/procedure(s) were performed by non-physician practitioner and as supervising physician I was immediately available for consultation/collaboration.  Luiz Blare MD   Luiz Blare, MD 04/28/12 [phone]

## 2012-05-27 ENCOUNTER — Ambulatory Visit
Admission: RE | Admit: 2012-05-27 | Discharge: 2012-05-27 | Disposition: A | Discharge status: Home / Self Care | Payer: PRIVATE HEALTH INSURANCE | Source: Ambulatory Visit | Attending: Family Medicine | Admitting: Family Medicine

## 2012-05-27 ENCOUNTER — Other Ambulatory Visit: Payer: Self-pay | Admitting: Family Medicine

## 2012-05-27 DIAGNOSIS — I809 Phlebitis and thrombophlebitis of unspecified site: Secondary | ICD-10-CM

## 2012-05-27 DIAGNOSIS — M25569 Pain in unspecified knee: Secondary | ICD-10-CM

## 2012-05-27 DIAGNOSIS — R609 Edema, unspecified: Secondary | ICD-10-CM

## 2012-09-07 ENCOUNTER — Other Ambulatory Visit: Payer: Self-pay | Admitting: Family Medicine

## 2012-09-13 ENCOUNTER — Ambulatory Visit
Admission: RE | Admit: 2012-09-13 | Discharge: 2012-09-13 | Disposition: A | Discharge status: Home / Self Care | Payer: PRIVATE HEALTH INSURANCE | Source: Ambulatory Visit | Attending: Family Medicine | Admitting: Family Medicine

## 2012-10-07 ENCOUNTER — Emergency Department (HOSPITAL_COMMUNITY)
Admission: EM | Admit: 2012-10-07 | Discharge: 2012-10-07 | Disposition: A | Discharge status: Home / Self Care | Payer: PRIVATE HEALTH INSURANCE | Attending: Emergency Medicine | Admitting: Emergency Medicine

## 2012-10-07 ENCOUNTER — Emergency Department (HOSPITAL_COMMUNITY): Payer: PRIVATE HEALTH INSURANCE

## 2012-10-07 ENCOUNTER — Encounter (HOSPITAL_COMMUNITY): Payer: Self-pay | Admitting: *Deleted

## 2012-10-07 DIAGNOSIS — R270 Ataxia, unspecified: Secondary | ICD-10-CM

## 2012-10-07 DIAGNOSIS — Z79899 Other long term (current) drug therapy: Secondary | ICD-10-CM | POA: Insufficient documentation

## 2012-10-07 DIAGNOSIS — R42 Dizziness and giddiness: Secondary | ICD-10-CM

## 2012-10-07 DIAGNOSIS — I69998 Other sequelae following unspecified cerebrovascular disease: Secondary | ICD-10-CM | POA: Insufficient documentation

## 2012-10-07 DIAGNOSIS — R112 Nausea with vomiting, unspecified: Secondary | ICD-10-CM | POA: Insufficient documentation

## 2012-10-07 DIAGNOSIS — I69993 Ataxia following unspecified cerebrovascular disease: Secondary | ICD-10-CM | POA: Insufficient documentation

## 2012-10-07 DIAGNOSIS — I10 Essential (primary) hypertension: Secondary | ICD-10-CM | POA: Insufficient documentation

## 2012-10-07 LAB — CBC
HCT: 43 % (ref 39.0–52.0)
Hemoglobin: 14.8 g/dL (ref 13.0–17.0)
MCH: 29.6 pg (ref 26.0–34.0)
MCHC: 34.4 g/dL (ref 30.0–36.0)
MCV: 86 fL (ref 78.0–100.0)
Platelets: 183 K/uL (ref 150–400)
RBC: 5 MIL/uL (ref 4.22–5.81)
RDW: 12.8 % (ref 11.5–15.5)
WBC: 5.2 K/uL (ref 4.0–10.5)

## 2012-10-07 LAB — BASIC METABOLIC PANEL
CO2: 26 mEq/L (ref 19–32)
Chloride: 93 mEq/L — ABNORMAL LOW (ref 96–112)
GFR calc Af Amer: >90 mL/min (ref >90)
Sodium: 131 mEq/L — ABNORMAL LOW (ref 135–145)

## 2012-10-07 LAB — TROPONIN I: Troponin I: <0.3 ng/mL (ref <0.30)

## 2012-10-07 MED ORDER — METOCLOPRAMIDE HCL 5 MG/ML IJ SOLN
10.0000 mg | Freq: Once | INTRAMUSCULAR | Status: AC
  Administered 2012-10-07: 10 mg via INTRAVENOUS
  Filled 2012-10-07: qty 2

## 2012-10-07 MED ORDER — DIPHENHYDRAMINE HCL 50 MG/ML IJ SOLN
12.5000 mg | Freq: Once | INTRAMUSCULAR | Status: AC
  Administered 2012-10-07: 12.5 mg via INTRAVENOUS
  Filled 2012-10-07: qty 1

## 2012-10-07 MED ORDER — GADOBENATE DIMEGLUMINE 529 MG/ML IV SOLN
19.0000 mL | Freq: Once | INTRAVENOUS | Status: AC | PRN
  Administered 2012-10-07: 19 mL via INTRAVENOUS

## 2012-10-07 MED ORDER — SODIUM CHLORIDE 0.9 % IV SOLN
Freq: Once | INTRAVENOUS | Status: AC
  Administered 2012-10-07: 16:00:00 via INTRAVENOUS

## 2012-10-07 MED ORDER — PROMETHAZINE HCL 25 MG PO TABS: 25.0000 mg | ORAL_TABLET | Freq: Four times a day (QID) | ORAL | Status: DC | PRN

## 2012-10-07 NOTE — ED Notes (Signed)
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

## 2012-10-07 NOTE — ED Notes (Signed)
Pt reported mild chest tightness after triaged done, RN made aware

## 2012-10-07 NOTE — Discharge Instructions (Signed)
Ataxia You have an unsteady walk called ataxia. Your condition may require further tests. Ataxia can be caused by:  Neurological conditions.  Infections.  Physical exhaustion.  Internal bleeding.  Alcohol intoxication, or medication side effects.  Problems with circulation, blood pressure, and heart disease can also make you unsteady. Laboratory and X-ray tests may be needed.  Treatment for now:  Get plenty of rest and eat a nutritious diet over the next weeks.  Avoid alcohol.  If you become very unsteady, dizzy, nauseated, or feel like you are going to faint, lie down flat right away.  Wait until all your symptoms pass before you get up again. SEEK IMMEDIATE MEDICAL CARE IF:  You develop severe unsteadiness, headache, chest pain, or abdominal pain.  You have weakness or numbness on one side of your body.  You have problems with your vision.  You develop confusion or difficulty speaking.  You have a fever, chills, or an irregular heartbeat or a very fast pulse. MAKE SURE YOU:   Understand these instructions.  Will watch your condition.  Will get help right away if you are not doing well or get worse. Document Released: 11/16/2005 Document Revised: 02/08/2012 Document Reviewed: 05/05/2007 [facility] Patient Information 2013 [place], [place]. Benign Positional Vertigo Vertigo means you feel like you or your surroundings are moving when they are not. Benign positional vertigo is the most common form of vertigo. Benign means that the cause of your condition is not serious. Benign positional vertigo is more common in older adults. CAUSES  Benign positional vertigo is the result of an upset in the labyrinth system. This is an area in the middle ear that helps control your balance. This may be caused by a viral infection, head injury, or repetitive motion. However, often no specific cause is found. SYMPTOMS  Symptoms of benign positional vertigo occur when you move your head  or eyes in different directions. Some of the symptoms may include:  Loss of balance and falls.  Vomiting.  Blurred vision.  Dizziness.  Nausea.  Involuntary eye movements (nystagmus). DIAGNOSIS  Benign positional vertigo is usually diagnosed by physical exam. If the specific cause of your benign positional vertigo is unknown, your caregiver may perform imaging tests, such as magnetic resonance imaging (MRI) or computed tomography (CT). TREATMENT  Your caregiver may recommend movements or procedures to correct the benign positional vertigo. Medicines such as meclizine, benzodiazepines, and medicines for nausea may be used to treat your symptoms. In rare cases, if your symptoms are caused by certain conditions that affect the inner ear, you may need surgery. HOME CARE INSTRUCTIONS   Follow your caregiver's instructions.  Move slowly. Do not make sudden body or head movements.  Avoid driving.  Avoid operating heavy machinery.  Avoid performing any tasks that would be dangerous to you or others during a vertigo episode.  Drink enough fluids to keep your urine clear or pale yellow. SEEK IMMEDIATE MEDICAL CARE IF:   You develop problems with walking, weakness, numbness, or using your arms, hands, or legs.  You have difficulty speaking.  You develop severe headaches.  Your nausea or vomiting continues or gets worse.  You develop visual changes.  Your family or friends notice any behavioral changes.  Your condition gets worse.  You have a fever.  You develop a stiff neck or sensitivity to light. MAKE SURE YOU:   Understand these instructions.  Will watch your condition.  Will get help right away if you are not doing well or get  worse. Document Released: 08/24/2006 Document Revised: 02/08/2012 Document Reviewed: 08/06/2011 [facility] Patient Information 2013 [place], [place].

## 2012-10-07 NOTE — ED Provider Notes (Signed)
History    This chart was scribed for Kevin Skene, MD, MD by Smitty Pluck, ED Scribe. The patient was seen in room APA08 and the patient's care was started at 3:05PM.   CSN: [id number]  Arrival date & time 10/07/12  1439      Chief Complaint  Patient presents with  . Dizziness    (Consider location/radiation/quality/duration/timing/severity/associated sxs/prior treatment) The history is provided by the patient. No language interpreter was used.   Kevin Travis is a 56 y.o. male who presents to the Emergency Department complaining of constant, dizziness onset today 7 hours ago. Symptoms started suddenly while at work. Pt reports having moderate nausea. He has hx of vertigo and HTN. He reports that current symptoms feel similar to past vertigo episodes but the room is not spinning. He says while the room is not spinning, there is a jerking sensation to his vision. He reports that his balance is off when he walks and he stumbles towards the left. Pt took 2 meclazine without relief. Denies SOB, cough, vomiting, otalgia, recent illness, smoking cigarettes and any other pain.  He mentions that he had physical exam 1 day ago with normal results. He reports having heart rate of 30 3 years ago due to his HTN medications being switched. Denies hx of lung illnesses.   Past Medical History  Diagnosis Date  . Hypertension     Past Surgical History  Procedure Date  . Back surgery   . Leg surgery   . Vascular surgery     History reviewed. No pertinent family history.  History  Substance Use Topics  . Smoking status: Never Smoker   . Smokeless tobacco: Not on file  . Alcohol Use: Yes     Comment: 12 beers/year, 4 oz. red wine at dinner      Review of Systems At least 10pt or greater review of systems completed and are negative except where specified in the HPI. Allergies  Sulfa antibiotics  Home Medications   Current Outpatient Rx  Name  Route  Sig  Dispense  Refill  .  AMLODIPINE BESY-BENAZEPRIL HCL 2.5-10 MG PO CAPS   Oral   Take 1 capsule by mouth daily.         . SERTRALINE HCL 25 MG PO TABS   Oral   Take 25 mg by mouth daily.           BP 137/95  Pulse 60  Resp 18  Ht 5\' 11"  (1.803 m)  Wt 200 lb (90.719 kg)  BMI 27.89 kg/m2  SpO2 100%  Physical Exam .  PHYSICAL EXAM: VITAL SIGNS:  . Filed Vitals:   10/07/12 1442  BP: 137/95  Pulse: 60  Resp: 18  Height: 5\' 11"  (1.803 m)  Weight: 200 lb (90.719 kg)  SpO2: 100%   CONSTITUTIONAL: Awake, oriented, appears non-toxic HENT: Atraumatic, normocephalic, oral mucosa pink and moist, airway patent. Nares patent without drainage. External ears normal. EYES: Conjunctiva clear, EOMI, PERRLA NECK: Trachea midline, non-tender, supple CARDIOVASCULAR: Normal heart rate, Normal rhythm, No murmurs, rubs, gallops PULMONARY/CHEST: Clear to auscultation, no rhonchi, wheezes, or rales. Symmetrical breath sounds. CHEST WALL: No lesions. Non-tender. ABDOMINAL: Non-distended, soft, non-tender - no rebound or guarding.  BS normal. NEUROLOGIC: YQ:MVHQIO fields intact.  Facial sensation equal to light touch bilaterally.  Good muscle bulk in the masseter muscle and good lateral movement of the jaw.  Facial expressions equal and good strength with smile/frown and puffed cheeks.  Hearing grossly intact to finger rub  test.  Uvula, tongue are midline with no deviation. Symmetrical palate elevation.  Trapezius and SCM muscles are 5/5 strength bilaterally.  Head impulse testing was equivocal - rapid head turning elicited minor symptoms to the right, but on testing EOM to right - patient became violently nauseous and vomited. DTR: Brachioradialis, biceps, patellar, Achilles tendon reflexes 2+ bilaterally.  No clonus. Strength: 5/5 strength flexors and extensors in the upper and lower extremities.  Grip strength, finger adduction/abduction 5/5. Sensation: Sensation intact distally to light touch, proprioception using  position testing of 2nd digit and great toe Cerebellar: No ataxia with walking or dysmetria with finger to nose, rapid alternating hand movements and heels to shin testing. Gait and Station: Pt has a mild drift to the left when walking after 4-5 steps. EXTREMITIES: No clubbing, cyanosis, or edema SKIN: Warm, Dry, No erythema, No rash   ED Course  Procedures (including critical care time) DIAGNOSTIC STUDIES:  Date: 10/07/2012  Rate: 60  Rhythm: PACs  QRS Axis: normal  Intervals: normal  ST/T Wave abnormalities: normal  Conduction Disutrbances: Incomplete RBBB  Narrative Interpretation: Nonischemic     Oxygen Saturation is 100% on room air, n by my interpretation.    COORDINATION OF CARE: 3:08 PM Discussed ED treatment with pt     Labs Reviewed  BASIC METABOLIC PANEL - Abnormal; Notable for the following:    Sodium 131 (*)     Potassium 3.0 (*)     Chloride 93 (*)     Glucose, Bld 114 (*)     Calcium 10.8 (*)     All other components within normal limits  CBC  TROPONIN I  LAB REPORT - SCANNED   Ct Head Wo Contrast  10/07/2012  *RADIOLOGY REPORT*  Clinical Data: Dizziness  CT HEAD WITHOUT CONTRAST  Technique:  Contiguous axial images were obtained from the base of the skull through the vertex without contrast.  Comparison: Brain MRI 10/07/2012  Findings: No acute intracranial hemorrhage.  No focal mass lesion. No CT evidence of acute infarction.   No midline shift or mass effect.  No hydrocephalus.  Basilar cisterns are patent. Paranasal sinuses and mastoid air cells are clear.  Orbits are normal.  IMPRESSION: No acute intracranial findings.   Original Report Authenticated By: Genevive Bi, M.D.    Mr Laqueta Jean ZO Contrast  10/07/2012  *RADIOLOGY REPORT*  Clinical Data: Dizziness.  Vertigo. Headache and confusion.  MRI HEAD WITHOUT AND WITH CONTRAST  Technique:  Multiplanar, multiecho pulse sequences of the brain and surrounding structures were obtained according to  standard protocol without and with intravenous contrast  Contrast: 19mL MULTIHANCE GADOBENATE DIMEGLUMINE 529 MG/ML IV SOLN  Comparison: 03/05/2009 MRI Brain.  Findings: There is no evidence for acute infarction, intracranial hemorrhage, mass lesion, hydrocephalus, or extra-axial fluid.  Mild premature cerebral and cerebellar atrophy is present.  Moderately advanced chronic microvascular ischemic change affects the periventricular and subcortical white matter, premature for the patient's age of 68.  Scattered lacunes are noted.  There is no foci of chronic hemorrhage.   Dolichoectatic carotid and basilar arteries without signs of proximal vascular thrombosis.  Normal skull base and calvarium.  Post infusion, there is no abnormal enhancement of the brain or meninges. The calvarium and skull base appear unremarkable.  Normal pituitary and cerebellar tonsils.  Chronic sinus disease.  No mastoid fluid.  Negative orbits. Compared with 2010, the small vessel disease has slightly progressed, although was notable at that time.  IMPRESSION:  No acute intracranial findings.  No acute stroke or abnormal intracranial enhancement.  Moderately premature chronic microvascular ischemic change and slight premature cerebral and cerebellar atrophy.  No intracranial mass lesion or temporal bone inflammatory process.   Original Report Authenticated By: Davonna Belling, M.D.      1. Vertigo   2. Ataxia   3. Nausea and vomiting       MDM  KALVYN DESA is a 56 y.o. male presenting with atypical vertigo symptoms not clearly ascribed to peripheral vertigo - concern for central cause.  Will obtain imagine of brain and labs. Doubt encephalopathy or infection. Pt denies EtOH.  Pt much better with phenergan dose - supports peripheral cause.  Also considering labyrinthitis, doubt Menire's without tinnitus.  Consider cerebellar infarct vs mass vs degeneration.  MRI/CT return with no acute findings.  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  Discussed findings with patient.  Labs mildly abnormal with mild hyponatremia, hypokalemia, hypochloremia and hypercalcemia - no requiring treatment.  10/07/2012 6:55 PM Discussed with Dr. Thad Ranger - [facility].  No evidence for mass of infarct on MRI/CT.  Evidence for cerebellar degeneration possibly causing mild ataxia.  Discussed with patient - he will follow up with neurology as an inpatient.  No concern for CNS infection, seizure.  Lab abnormalities could be secondary to vomiting.  I explained the diagnosis and have given explicit precautions to return to the ER including focal neurologic deficits or any other new or worsening symptoms. The patient understands and accepts the medical plan as it's been dictated and I have answered their questions. Discharge instructions concerning home care and prescriptions have been given.  The patient is STABLE and is discharged to home in good condition.    I personally performed the services described in this documentation, which was scribed in my presence. The recorded information has been reviewed and is accurate.   Kevin Skene, MD 10/08/12 2112

## 2012-10-07 NOTE — ED Notes (Signed)
Pt ambulated around nurses desk. Pt walked 5-6 feet and then stumbled to the left. Pt continued to stumble to the left. Pt stated he did not feel dizzy while ambulating. Pt needed minimal assistance while ambulating.

## 2012-12-13 ENCOUNTER — Other Ambulatory Visit: Payer: Self-pay | Admitting: Family Medicine

## 2012-12-13 ENCOUNTER — Ambulatory Visit
Admission: RE | Admit: 2012-12-13 | Discharge: 2012-12-13 | Disposition: A | Discharge status: Home / Self Care | Payer: PRIVATE HEALTH INSURANCE | Source: Ambulatory Visit | Attending: Family Medicine | Admitting: Family Medicine

## 2012-12-13 DIAGNOSIS — R52 Pain, unspecified: Secondary | ICD-10-CM

## 2012-12-13 DIAGNOSIS — R609 Edema, unspecified: Secondary | ICD-10-CM

## 2014-03-08 ENCOUNTER — Ambulatory Visit: Payer: PRIVATE HEALTH INSURANCE | Attending: Family Medicine | Admitting: Physical Therapy

## 2014-03-08 DIAGNOSIS — R5381 Other malaise: Secondary | ICD-10-CM | POA: Insufficient documentation

## 2014-03-08 DIAGNOSIS — IMO0001 Reserved for inherently not codable concepts without codable children: Secondary | ICD-10-CM | POA: Insufficient documentation

## 2014-03-08 DIAGNOSIS — M25539 Pain in unspecified wrist: Secondary | ICD-10-CM | POA: Insufficient documentation

## 2014-03-13 ENCOUNTER — Ambulatory Visit: Payer: PRIVATE HEALTH INSURANCE | Admitting: Rehabilitation

## 2014-03-15 ENCOUNTER — Ambulatory Visit: Payer: PRIVATE HEALTH INSURANCE | Admitting: Physical Therapy

## 2014-03-20 ENCOUNTER — Ambulatory Visit: Payer: PRIVATE HEALTH INSURANCE

## 2014-03-20 ENCOUNTER — Encounter: Payer: PRIVATE HEALTH INSURANCE | Admitting: Physical Therapy

## 2014-03-22 ENCOUNTER — Ambulatory Visit: Payer: PRIVATE HEALTH INSURANCE | Admitting: Rehabilitation

## 2014-03-27 ENCOUNTER — Ambulatory Visit: Payer: PRIVATE HEALTH INSURANCE | Admitting: Rehabilitation

## 2014-03-29 ENCOUNTER — Ambulatory Visit: Payer: PRIVATE HEALTH INSURANCE | Admitting: Rehabilitation

## 2014-03-30 ENCOUNTER — Other Ambulatory Visit: Payer: Self-pay | Admitting: Gastroenterology

## 2014-04-03 ENCOUNTER — Ambulatory Visit: Payer: PRIVATE HEALTH INSURANCE | Attending: Family Medicine | Admitting: Rehabilitation

## 2014-04-03 DIAGNOSIS — IMO0001 Reserved for inherently not codable concepts without codable children: Secondary | ICD-10-CM | POA: Diagnosis not present

## 2014-04-03 DIAGNOSIS — M25539 Pain in unspecified wrist: Secondary | ICD-10-CM | POA: Diagnosis not present

## 2014-04-03 DIAGNOSIS — R5381 Other malaise: Secondary | ICD-10-CM | POA: Insufficient documentation

## 2014-04-05 ENCOUNTER — Ambulatory Visit: Payer: PRIVATE HEALTH INSURANCE

## 2014-04-05 DIAGNOSIS — IMO0001 Reserved for inherently not codable concepts without codable children: Secondary | ICD-10-CM | POA: Diagnosis not present

## 2014-04-10 ENCOUNTER — Ambulatory Visit: Payer: PRIVATE HEALTH INSURANCE | Admitting: Rehabilitation

## 2014-04-12 ENCOUNTER — Ambulatory Visit: Payer: PRIVATE HEALTH INSURANCE | Admitting: Physical Therapy

## 2014-04-12 DIAGNOSIS — IMO0001 Reserved for inherently not codable concepts without codable children: Secondary | ICD-10-CM | POA: Diagnosis not present

## 2014-04-17 ENCOUNTER — Ambulatory Visit: Payer: PRIVATE HEALTH INSURANCE | Admitting: Rehabilitation

## 2014-04-17 DIAGNOSIS — IMO0001 Reserved for inherently not codable concepts without codable children: Secondary | ICD-10-CM | POA: Diagnosis not present

## 2014-04-19 ENCOUNTER — Ambulatory Visit: Payer: PRIVATE HEALTH INSURANCE | Admitting: Rehabilitation

## 2014-04-19 DIAGNOSIS — IMO0001 Reserved for inherently not codable concepts without codable children: Secondary | ICD-10-CM | POA: Diagnosis not present

## 2015-03-23 ENCOUNTER — Encounter (HOSPITAL_COMMUNITY): Payer: Self-pay | Admitting: Nurse Practitioner

## 2015-03-23 ENCOUNTER — Emergency Department (HOSPITAL_COMMUNITY)
Admission: EM | Admit: 2015-03-23 | Discharge: 2015-03-23 | Disposition: A | Discharge status: Home / Self Care | Payer: PRIVATE HEALTH INSURANCE | Attending: Emergency Medicine | Admitting: Emergency Medicine

## 2015-03-23 ENCOUNTER — Emergency Department (HOSPITAL_COMMUNITY): Payer: PRIVATE HEALTH INSURANCE

## 2015-03-23 DIAGNOSIS — I10 Essential (primary) hypertension: Secondary | ICD-10-CM | POA: Diagnosis not present

## 2015-03-23 DIAGNOSIS — Z792 Long term (current) use of antibiotics: Secondary | ICD-10-CM | POA: Diagnosis not present

## 2015-03-23 DIAGNOSIS — Z79899 Other long term (current) drug therapy: Secondary | ICD-10-CM | POA: Diagnosis not present

## 2015-03-23 DIAGNOSIS — R1032 Left lower quadrant pain: Secondary | ICD-10-CM

## 2015-03-23 DIAGNOSIS — K5732 Diverticulitis of large intestine without perforation or abscess without bleeding: Secondary | ICD-10-CM | POA: Diagnosis not present

## 2015-03-23 LAB — COMPREHENSIVE METABOLIC PANEL
ALBUMIN: 4.4 g/dL (ref 3.5–5.2)
ALK PHOS: 110 U/L (ref 39–117)
ALT: 37 U/L (ref 0–53)
AST: 24 U/L (ref 0–37)
Anion gap: 8 (ref 5–15)
BILIRUBIN TOTAL: 2.1 mg/dL — AB (ref 0.3–1.2)
BUN: 17 mg/dL (ref 6–23)
CHLORIDE: 102 mmol/L (ref 96–112)
CO2: 24 mmol/L (ref 19–32)
Calcium: 9.9 mg/dL (ref 8.4–10.5)
Creatinine, Ser: 1.23 mg/dL (ref 0.50–1.35)
GFR calc Af Amer: 74 mL/min — ABNORMAL LOW (ref >90)
GFR, EST NON AFRICAN AMERICAN: 64 mL/min — AB (ref >90)
Glucose, Bld: 99 mg/dL (ref 70–99)
POTASSIUM: 3.7 mmol/L (ref 3.5–5.1)
SODIUM: 134 mmol/L — AB (ref 135–145)
Total Protein: 7 g/dL (ref 6.0–8.3)

## 2015-03-23 LAB — URINALYSIS, ROUTINE W REFLEX MICROSCOPIC
Bilirubin Urine: NEGATIVE
GLUCOSE, UA: NEGATIVE mg/dL
HGB URINE DIPSTICK: NEGATIVE
Ketones, ur: NEGATIVE mg/dL
Nitrite: NEGATIVE
PH: 5 (ref 5.0–8.0)
Protein, ur: NEGATIVE mg/dL
SPECIFIC GRAVITY, URINE: 1.016 (ref 1.005–1.030)
UROBILINOGEN UA: 0.2 mg/dL (ref 0.0–1.0)

## 2015-03-23 LAB — CBC WITH DIFFERENTIAL/PLATELET
BASOS ABS: 0 10*3/uL (ref 0.0–0.1)
Basophils Relative: 0 % (ref 0–1)
Eosinophils Absolute: 0 10*3/uL (ref 0.0–0.7)
Eosinophils Relative: 0 % (ref 0–5)
HEMATOCRIT: 42.1 % (ref 39.0–52.0)
Hemoglobin: 14.3 g/dL (ref 13.0–17.0)
Lymphocytes Relative: 15 % (ref 12–46)
Lymphs Abs: 1.1 10*3/uL (ref 0.7–4.0)
MCH: 29.4 pg (ref 26.0–34.0)
MCHC: 34 g/dL (ref 30.0–36.0)
MCV: 86.4 fL (ref 78.0–100.0)
MONOS PCT: 10 % (ref 3–12)
Monocytes Absolute: 0.8 10*3/uL (ref 0.1–1.0)
NEUTROS PCT: 75 % (ref 43–77)
Neutro Abs: 5.6 10*3/uL (ref 1.7–7.7)
Platelets: 161 10*3/uL (ref 150–400)
RBC: 4.87 MIL/uL (ref 4.22–5.81)
RDW: 12.8 % (ref 11.5–15.5)
WBC: 7.6 10*3/uL (ref 4.0–10.5)

## 2015-03-23 LAB — URINE MICROSCOPIC-ADD ON

## 2015-03-23 MED ORDER — OXYCODONE-ACETAMINOPHEN 5-325 MG PO TABS: 1.0000 | ORAL_TABLET | ORAL | Status: DC | PRN

## 2015-03-23 MED ORDER — IOHEXOL 300 MG/ML  SOLN
25.0000 mL | Freq: Once | INTRAMUSCULAR | Status: AC | PRN
  Administered 2015-03-23: 25 mL via ORAL

## 2015-03-23 MED ORDER — ONDANSETRON 8 MG PO TBDP: 8.0000 mg | ORAL_TABLET | Freq: Three times a day (TID) | ORAL | Status: DC | PRN

## 2015-03-23 MED ORDER — IOHEXOL 300 MG/ML  SOLN: 100.0000 mL | Freq: Once | INTRAMUSCULAR | Status: DC | PRN

## 2015-03-23 MED ORDER — SODIUM CHLORIDE 0.9 % IV BOLUS (SEPSIS)
500.0000 mL | Freq: Once | INTRAVENOUS | Status: AC
  Administered 2015-03-23: 500 mL via INTRAVENOUS

## 2015-03-23 NOTE — ED Notes (Signed)
Pt transporting to CT at current time.  

## 2015-03-23 NOTE — ED Notes (Signed)
Pt back from CT

## 2015-03-23 NOTE — ED Provider Notes (Signed)
Physical [age] year old man with a recent clinical diagnosis of diverticulitis. He was placed on Cipro and Flagyl for the past 2 days. He had continuing pain and came in secondary to that. Labs here are normal. Patient had CT that shows simple complicated diverticulitis. The patient reports that he has good pain control now. He has not had nausea or vomiting, fever, or chills. He has good access to primary care physician. I discussed need for follow-up and return precautions. He will continue his Cipro and Flagyl. I will give him a prescription for Percocet and Zofran.  Margarita Grizzleanielle Essex Perry, MD 03/23/15 [phone]

## 2015-03-23 NOTE — ED Notes (Addendum)
Hes been having LLQ pain since Thursday. Saw his PCP yesterday who put him on abx for possible diverticulitis. He has taken 3 doses of antibiotic and pain continues. hes had some contsipation this week but was able to have BM yesterday ma. Denies blood in stool, diarrhea, n/v. The pain "feels like gas pains." he had a normal colonoscopy last year.

## 2015-03-23 NOTE — Discharge Instructions (Signed)
Diverticulitis °Diverticulitis is when small pockets that have formed in your colon (large intestine) become infected or swollen. °HOME CARE °· Follow your doctor's instructions. °· Follow a special diet if told by your doctor. °· When you feel better, your doctor may tell you to change your diet. You may be told to eat a lot of fiber. Fruits and vegetables are good sources of fiber. Fiber makes it easier to poop (have bowel movements). °· Take supplements or probiotics as told by your doctor. °· Only take medicines as told by your doctor. °· Keep all follow-up visits with your doctor. °GET HELP IF: °· Your pain does not get better. °· You have a hard time eating food. °· You are not pooping like normal. °GET HELP RIGHT AWAY IF: °· Your pain gets worse. °· Your problems do not get better. °· Your problems suddenly get worse. °· You have a fever. °· You keep throwing up (vomiting). °· You have bloody or black, tarry poop (stool). °MAKE SURE YOU:  °· Understand these instructions. °· Will watch your condition. °· Will get help right away if you are not doing well or get worse. °Document Released: 05/04/2008 Document Revised: 11/21/2013 Document Reviewed: 10/11/2013 °Exit[employer]® Patient Information ©2015 [employer]. This information is not intended to replace advice given to you by your health care provider. Make sure you discuss any questions you have with your health care provider. ° °

## 2015-03-23 NOTE — ED Provider Notes (Signed)
CSN: [id number]     Arrival date & time 03/23/15  1323 History   First MD Initiated Contact with Patient 03/23/15 1347     Chief Complaint  Patient presents with  . Abdominal Pain     (Consider location/radiation/quality/duration/timing/severity/associated sxs/prior Treatment) HPI Comments: 59 year old male with history of high blood pressure presents with worsening left lower quadrant pain since Thursday, patient evaluated by primary doctor and placed on antibiotics Cipro and Flagyl for likely diverticulitis however no improvement. No diarrhea or vomiting, no recent surgeries, no fevers or chills. No blood in the stools. Patient normal colonoscopy per report last year. Symptoms fairly constant. Ache sensation.  Patient is a 59 y.o. male presenting with abdominal pain. The history is provided by the patient.  Abdominal Pain Associated symptoms: no chest pain, no chills, no diarrhea, no dysuria, no fever, no shortness of breath and no vomiting     Past Medical History  Diagnosis Date  . Hypertension    Past Surgical History  Procedure Laterality Date  . Back surgery    . Leg surgery    . Vascular surgery     History reviewed. No pertinent family history. History  Substance Use Topics  . Smoking status: Never Smoker   . Smokeless tobacco: Not on file  . Alcohol Use: Yes     Comment: 12 beers/year, 4 oz. red wine at dinner    Review of Systems  Constitutional: Negative for fever and chills.  HENT: Negative for congestion.   Eyes: Negative for visual disturbance.  Respiratory: Negative for shortness of breath.   Cardiovascular: Negative for chest pain.  Gastrointestinal: Positive for abdominal pain. Negative for vomiting and diarrhea.  Genitourinary: Negative for dysuria and flank pain.  Musculoskeletal: Negative for back pain, neck pain and neck stiffness.  Skin: Negative for rash.  Neurological: Negative for light-headedness and headaches.      Allergies  Sulfa  antibiotics  Home Medications   Prior to Admission medications   Medication Sig Start Date End Date Taking? Authorizing Provider  amLODipine (NORVASC) 10 MG tablet Take 10 mg by mouth daily.   Yes Historical Provider, MD  ciprofloxacin (CIPRO) 500 MG tablet Take 500 mg by mouth 2 (two) times daily.   Yes Historical Provider, MD  metroNIDAZOLE (FLAGYL) 500 MG tablet Take 500 mg by mouth 3 (three) times daily.   Yes Historical Provider, MD   BP 105/72 mmHg  Pulse 62  Temp(Src) 98.1 F (36.7 C)  Resp 18  Ht 5\' 11"  (1.803 m)  Wt 197 lb (89.359 kg)  BMI 27.49 kg/m2  SpO2 99% Physical Exam  Constitutional: He is oriented to person, place, and time. He appears well-developed and well-nourished.  HENT:  Head: Normocephalic and atraumatic.  Eyes: Conjunctivae are normal. Right eye exhibits no discharge. Left eye exhibits no discharge.  Neck: Normal range of motion. Neck supple. No tracheal deviation present.  Cardiovascular: Normal rate and regular rhythm.   Pulmonary/Chest: Effort normal and breath sounds normal.  Abdominal: Soft. He exhibits no distension. There is tenderness (left lower quadrant pain mild no peritonitis). There is no guarding.  Musculoskeletal: He exhibits no edema.  Neurological: He is alert and oriented to person, place, and time.  Skin: Skin is warm. No rash noted.  Psychiatric: He has a normal mood and affect.  Nursing note and vitals reviewed.   ED Course  Procedures (including critical care time) Labs Review Labs Reviewed  COMPREHENSIVE METABOLIC PANEL - Abnormal; Notable for the following:  Sodium 134 (*)    Total Bilirubin 2.1 (*)    GFR calc non Af Amer 64 (*)    GFR calc Af Amer 74 (*)    All other components within normal limits  URINALYSIS, ROUTINE W REFLEX MICROSCOPIC - Abnormal; Notable for the following:    Color, Urine AMBER (*)    Leukocytes, UA SMALL (*)    All other components within normal limits  CBC WITH DIFFERENTIAL/PLATELET  URINE  MICROSCOPIC-ADD ON    Imaging Review No results found.   EKG Interpretation None      MDM   Final diagnoses:  Abdominal pain, left lower quadrant   Patient presents with left lower quadrant pain concern for diverticulitis however with no improvement despite antibiotics CT scan for further delineation to ensure no abscess. Discussed with patient likely needs a couple more days of antibiotics and time. CT scan results pending. Patient be signed out to follow-up CT results and reassess. Vitals unremarkable.  Results and differential diagnosis were discussed with the patient/parent/guardian.   Medications  sodium chloride 0.9 % bolus 500 mL (500 mLs Intravenous New Bag/Given 03/23/15 1459)  iohexol (OMNIPAQUE) 300 MG/ML solution 25 mL (25 mLs Oral Contrast Given 03/23/15 1507)    Filed Vitals:   03/23/15 1430 03/23/15 1445 03/23/15 1500 03/23/15 1515  BP: 111/78 112/78 113/77 105/72  Pulse: 67 65 66 62  Temp:      Resp:      Height:      Weight:      SpO2: 96% 97% 96% 99%    Final diagnoses:  Abdominal pain, left lower quadrant       Blane Ohara, MD 03/23/15 1555

## 2015-10-21 ENCOUNTER — Other Ambulatory Visit: Payer: Self-pay | Admitting: Family Medicine

## 2015-10-22 ENCOUNTER — Other Ambulatory Visit: Payer: Self-pay | Admitting: Family Medicine

## 2015-10-22 DIAGNOSIS — R7989 Other specified abnormal findings of blood chemistry: Secondary | ICD-10-CM

## 2015-10-22 DIAGNOSIS — K824 Cholesterolosis of gallbladder: Secondary | ICD-10-CM

## 2015-10-22 DIAGNOSIS — R945 Abnormal results of liver function studies: Secondary | ICD-10-CM

## 2015-10-30 ENCOUNTER — Ambulatory Visit
Admission: RE | Admit: 2015-10-30 | Discharge: 2015-10-30 | Disposition: A | Discharge status: Home / Self Care | Payer: PRIVATE HEALTH INSURANCE | Source: Ambulatory Visit | Attending: Family Medicine | Admitting: Family Medicine

## 2015-10-30 DIAGNOSIS — R945 Abnormal results of liver function studies: Secondary | ICD-10-CM

## 2015-10-30 DIAGNOSIS — R7989 Other specified abnormal findings of blood chemistry: Secondary | ICD-10-CM

## 2015-10-30 DIAGNOSIS — K824 Cholesterolosis of gallbladder: Secondary | ICD-10-CM

## 2016-08-08 ENCOUNTER — Other Ambulatory Visit: Payer: Self-pay | Admitting: Family Medicine

## 2016-08-08 DIAGNOSIS — R1310 Dysphagia, unspecified: Secondary | ICD-10-CM

## 2016-08-13 ENCOUNTER — Other Ambulatory Visit: Payer: PRIVATE HEALTH INSURANCE

## 2016-08-17 DIAGNOSIS — N401 Enlarged prostate with lower urinary tract symptoms: Secondary | ICD-10-CM | POA: Insufficient documentation

## 2016-08-17 DIAGNOSIS — R102 Pelvic and perineal pain: Secondary | ICD-10-CM | POA: Insufficient documentation

## 2016-08-19 ENCOUNTER — Other Ambulatory Visit: Payer: Self-pay | Admitting: Physician Assistant

## 2016-08-19 ENCOUNTER — Other Ambulatory Visit: Payer: Self-pay | Admitting: Family Medicine

## 2016-08-19 ENCOUNTER — Ambulatory Visit
Admission: RE | Admit: 2016-08-19 | Discharge: 2016-08-19 | Disposition: A | Discharge status: Home / Self Care | Payer: PRIVATE HEALTH INSURANCE | Source: Ambulatory Visit | Attending: Family Medicine | Admitting: Family Medicine

## 2016-08-19 DIAGNOSIS — R102 Pelvic and perineal pain: Secondary | ICD-10-CM

## 2016-08-19 DIAGNOSIS — R1310 Dysphagia, unspecified: Secondary | ICD-10-CM

## 2016-08-21 ENCOUNTER — Ambulatory Visit
Admission: RE | Admit: 2016-08-21 | Discharge: 2016-08-21 | Disposition: A | Discharge status: Home / Self Care | Payer: PRIVATE HEALTH INSURANCE | Source: Ambulatory Visit | Attending: Physician Assistant | Admitting: Physician Assistant

## 2016-08-21 ENCOUNTER — Ambulatory Visit
Admission: RE | Admit: 2016-08-21 | Discharge: 2016-08-21 | Disposition: A | Discharge status: Home / Self Care | Payer: PRIVATE HEALTH INSURANCE | Source: Ambulatory Visit | Attending: Family Medicine | Admitting: Family Medicine

## 2016-08-21 DIAGNOSIS — R1310 Dysphagia, unspecified: Secondary | ICD-10-CM

## 2016-08-21 DIAGNOSIS — R102 Pelvic and perineal pain: Secondary | ICD-10-CM

## 2016-10-15 ENCOUNTER — Other Ambulatory Visit: Payer: Self-pay | Admitting: Family Medicine

## 2016-10-15 DIAGNOSIS — K824 Cholesterolosis of gallbladder: Secondary | ICD-10-CM

## 2016-10-30 ENCOUNTER — Ambulatory Visit
Admission: RE | Admit: 2016-10-30 | Discharge: 2016-10-30 | Disposition: A | Discharge status: Home / Self Care | Payer: PRIVATE HEALTH INSURANCE | Source: Ambulatory Visit | Attending: Family Medicine | Admitting: Family Medicine

## 2016-10-30 DIAGNOSIS — K824 Cholesterolosis of gallbladder: Secondary | ICD-10-CM

## 2017-08-04 ENCOUNTER — Other Ambulatory Visit: Payer: Self-pay | Admitting: Family Medicine

## 2017-08-04 DIAGNOSIS — K8020 Calculus of gallbladder without cholecystitis without obstruction: Secondary | ICD-10-CM

## 2017-08-04 DIAGNOSIS — R740 Nonspecific elevation of levels of transaminase and lactic acid dehydrogenase [LDH]: Principal | ICD-10-CM

## 2017-08-04 DIAGNOSIS — R7401 Elevation of levels of liver transaminase levels: Secondary | ICD-10-CM

## 2017-08-20 ENCOUNTER — Other Ambulatory Visit: Payer: PRIVATE HEALTH INSURANCE

## 2017-08-25 ENCOUNTER — Ambulatory Visit
Admission: RE | Admit: 2017-08-25 | Discharge: 2017-08-25 | Disposition: A | Discharge status: Home / Self Care | Payer: PRIVATE HEALTH INSURANCE | Source: Ambulatory Visit | Attending: Family Medicine | Admitting: Family Medicine

## 2017-08-25 DIAGNOSIS — K8020 Calculus of gallbladder without cholecystitis without obstruction: Secondary | ICD-10-CM

## 2017-08-25 DIAGNOSIS — R7401 Elevation of levels of liver transaminase levels: Secondary | ICD-10-CM

## 2017-08-25 DIAGNOSIS — R740 Nonspecific elevation of levels of transaminase and lactic acid dehydrogenase [LDH]: Principal | ICD-10-CM

## 2018-11-15 ENCOUNTER — Other Ambulatory Visit: Payer: Self-pay

## 2018-11-15 ENCOUNTER — Emergency Department (HOSPITAL_COMMUNITY): Payer: PRIVATE HEALTH INSURANCE

## 2018-11-15 ENCOUNTER — Encounter (HOSPITAL_COMMUNITY): Payer: Self-pay

## 2018-11-15 ENCOUNTER — Emergency Department (HOSPITAL_COMMUNITY)
Admission: EM | Admit: 2018-11-15 | Discharge: 2018-11-15 | Disposition: A | Discharge status: Home / Self Care | Payer: PRIVATE HEALTH INSURANCE | Attending: Emergency Medicine | Admitting: Emergency Medicine

## 2018-11-15 DIAGNOSIS — M25522 Pain in left elbow: Secondary | ICD-10-CM | POA: Diagnosis present

## 2018-11-15 DIAGNOSIS — R0789 Other chest pain: Secondary | ICD-10-CM | POA: Diagnosis not present

## 2018-11-15 DIAGNOSIS — M79601 Pain in right arm: Secondary | ICD-10-CM | POA: Insufficient documentation

## 2018-11-15 DIAGNOSIS — I10 Essential (primary) hypertension: Secondary | ICD-10-CM | POA: Diagnosis not present

## 2018-11-15 DIAGNOSIS — Z79899 Other long term (current) drug therapy: Secondary | ICD-10-CM | POA: Insufficient documentation

## 2018-11-15 DIAGNOSIS — R079 Chest pain, unspecified: Secondary | ICD-10-CM

## 2018-11-15 LAB — CBC WITH DIFFERENTIAL/PLATELET
Abs Immature Granulocytes: 0.02 10*3/uL (ref 0.00–0.07)
BASOS PCT: 0 %
Basophils Absolute: 0 10*3/uL (ref 0.0–0.1)
EOS ABS: 0.1 10*3/uL (ref 0.0–0.5)
EOS PCT: 1 %
HCT: 47.3 % (ref 39.0–52.0)
Hemoglobin: 14.8 g/dL (ref 13.0–17.0)
Immature Granulocytes: 0 %
Lymphocytes Relative: 24 %
Lymphs Abs: 1.1 10*3/uL (ref 0.7–4.0)
MCH: 27.5 pg (ref 26.0–34.0)
MCHC: 31.3 g/dL (ref 30.0–36.0)
MCV: 87.8 fL (ref 80.0–100.0)
MONO ABS: 0.4 10*3/uL (ref 0.1–1.0)
MONOS PCT: 8 %
Neutro Abs: 3.1 10*3/uL (ref 1.7–7.7)
Neutrophils Relative %: 67 %
PLATELETS: 184 10*3/uL (ref 150–400)
RBC: 5.39 MIL/uL (ref 4.22–5.81)
RDW: 12.9 % (ref 11.5–15.5)
WBC: 4.7 10*3/uL (ref 4.0–10.5)
nRBC: 0 % (ref 0.0–0.2)

## 2018-11-15 LAB — COMPREHENSIVE METABOLIC PANEL
ALK PHOS: 102 U/L (ref 38–126)
ALT: 46 U/L — AB (ref 0–44)
AST: 27 U/L (ref 15–41)
Albumin: 5.1 g/dL — ABNORMAL HIGH (ref 3.5–5.0)
Anion gap: 12 (ref 5–15)
BUN: 17 mg/dL (ref 6–20)
CALCIUM: 10.5 mg/dL — AB (ref 8.9–10.3)
CO2: 25 mmol/L (ref 22–32)
CREATININE: 1.15 mg/dL (ref 0.61–1.24)
Chloride: 101 mmol/L (ref 98–111)
GFR calc non Af Amer: >60 mL/min (ref >60)
Glucose, Bld: 95 mg/dL (ref 70–99)
Potassium: 4 mmol/L (ref 3.5–5.1)
Sodium: 138 mmol/L (ref 135–145)
Total Bilirubin: 1.3 mg/dL — ABNORMAL HIGH (ref 0.3–1.2)
Total Protein: 8 g/dL (ref 6.5–8.1)

## 2018-11-15 LAB — MAGNESIUM: MAGNESIUM: 2 mg/dL (ref 1.7–2.4)

## 2018-11-15 LAB — TROPONIN I

## 2018-11-15 LAB — CBG MONITORING, ED: GLUCOSE-CAPILLARY: 94 mg/dL (ref 70–99)

## 2018-11-15 MED ORDER — PREDNISONE 20 MG PO TABS: 40.0000 mg | ORAL_TABLET | Freq: Every day | ORAL | 0 refills | Status: DC

## 2018-11-15 MED ORDER — ASPIRIN 81 MG PO CHEW
324.0000 mg | CHEWABLE_TABLET | Freq: Once | ORAL | Status: AC
  Administered 2018-11-15: 324 mg via ORAL
  Filled 2018-11-15: qty 4

## 2018-11-15 NOTE — ED Notes (Signed)
Patient verbalizes understanding of discharge instructions. Opportunity for questioning and answers were provided. Pt discharged from ED. 

## 2018-11-15 NOTE — Discharge Instructions (Signed)
As discussed, your evaluation today has been largely reassuring.  But, it is important that you monitor your condition carefully, and do not hesitate to return to the ED if you develop new, or concerning changes in your condition. ? ?Otherwise, please follow-up with your physician for appropriate ongoing care. ? ?

## 2018-11-15 NOTE — ED Triage Notes (Signed)
Pt reports left elbow pain and tingling for the past 3-4 days that does not radiate. Pt also c.o lower back pain. Denies any injury

## 2018-11-15 NOTE — ED Notes (Signed)
IV RFA removed; site clean, dry, intact

## 2018-11-15 NOTE — ED Provider Notes (Signed)
[facility] EMERGENCY DEPARTMENT Provider Note   CSN: [id number] Arrival date & time: 11/15/18  1245     History   Chief Complaint Chief Complaint  Patient presents with  . Elbow Pain  . Back Pain    HPI Kevin Travis is a 62 y.o. male.  HPI Patient presents with concern of pain about his left elbow. Pain is episodic, occurring for the past 2 or 3 days, brief, not radiating to his hand, and inconsistent in its presence. No clear precipitating, alleviating, exacerbating factors Patient describes it as a brief shocklike.,  Similar to hitting his funny bone. After additional questioning, the patient describes dull chest pressure, present for the past 3 days as well. No new dyspnea, no fever, no chills, no cough. Patient has history of hypertension, no history of cardiac disease. Past Medical History:  Diagnosis Date  . Hypertension     There are no active problems to display for this patient.   Past Surgical History:  Procedure Laterality Date  . BACK SURGERY    . LEG SURGERY    . VASCULAR SURGERY          Home Medications    Prior to Admission medications   Medication Sig Start Date End Date Taking? Authorizing Provider  amLODipine (NORVASC) 10 MG tablet Take 10 mg by mouth daily.    [provider]  ciprofloxacin (CIPRO) 500 MG tablet Take 500 mg by mouth 2 (two) times daily.    [provider]  metroNIDAZOLE (FLAGYL) 500 MG tablet Take 500 mg by mouth 3 (three) times daily.    [provider]  ondansetron (ZOFRAN ODT) 8 MG disintegrating tablet Take 1 tablet (8 mg total) by mouth every 8 (eight) hours as needed for nausea or vomiting. 03/23/15   Margarita Grizzle, MD  oxyCODONE-acetaminophen (PERCOCET/ROXICET) 5-325 MG per tablet Take 1-2 tablets by mouth every 4 (four) hours as needed for severe pain. 03/23/15   Margarita Grizzle, MD  predniSONE (DELTASONE) 20 MG tablet Take 2 tablets (40 mg total) by mouth daily with  breakfast. For the next four days 11/15/18   Gerhard Munch, MD    Family History No family history on file.  Social History Social History   Tobacco Use  . Smoking status: Never Smoker  Substance Use Topics  . Alcohol use: Yes    Comment: 12 beers/year, 4 oz. red wine at dinner  . Drug use: No     Allergies   Sulfa antibiotics   Review of Systems Review of Systems  Constitutional:       Per HPI, otherwise negative  HENT:       Per HPI, otherwise negative  Respiratory:       Per HPI, otherwise negative  Cardiovascular:       Per HPI, otherwise negative  Gastrointestinal: Negative for vomiting.  Endocrine:       Negative aside from HPI  Genitourinary:       Neg aside from HPI   Musculoskeletal:       Per HPI, otherwise negative  Skin: Negative.   Neurological: Negative for syncope.     Physical Exam Updated Vital Signs BP (!) 140/99   Pulse 65   Temp 97.9 F (36.6 C) (Oral)   Resp 16   Ht 5\' 11"  (1.803 m)   Wt 96.2 kg   SpO2 99%   BMI 29.57 kg/m   Physical Exam Vitals signs and nursing note reviewed.  Constitutional:  General: He is not in acute distress.    Appearance: He is well-developed.  HENT:     Head: Normocephalic and atraumatic.  Eyes:     Conjunctiva/sclera: Conjunctivae normal.  Cardiovascular:     Rate and Rhythm: Normal rate and regular rhythm.  Pulmonary:     Effort: Pulmonary effort is normal. No respiratory distress.     Breath sounds: No stridor.  Abdominal:     General: There is no distension.  Musculoskeletal:     Left elbow: Normal.     Left wrist: Normal.  Skin:    General: Skin is warm and dry.  Neurological:     Mental Status: He is alert and oriented to person, place, and time.      ED Treatments / Results  Labs (all labs ordered are listed, but only abnormal results are displayed) Labs Reviewed  COMPREHENSIVE METABOLIC PANEL - Abnormal; Notable for the following components:      Result Value    Calcium 10.5 (*)    Albumin 5.1 (*)    ALT 46 (*)    Total Bilirubin 1.3 (*)    All other components within normal limits  MAGNESIUM  TROPONIN I  CBC WITH DIFFERENTIAL/PLATELET  CBG MONITORING, ED    EKG EKG Interpretation  Date/Time:  Tuesday November 15 2018 14:04:17 EST Ventricular Rate:  65 PR Interval:  162 QRS Duration: 96 QT Interval:  386 QTC Calculation: 401 R Axis:   -25 Text Interpretation:  Normal sinus rhythm Incomplete right bundle branch block No significant change since last tracing Abnormal ekg Confirmed by Gerhard MunchLockwood, Lilias Lorensen [phone]) on 11/15/2018 3:01:31 PM   Radiology Dg Chest 2 View  Result Date: 11/15/2018 CLINICAL DATA:  Left-sided chest and arm pain left-sided chest and arm pain EXAM: CHEST - 2 VIEW COMPARISON:  05/12/2010 chest CT, 05/05/2010 CXR FINDINGS: The heart size and mediastinal contours are within normal limits. Both lungs are clear. The visualized skeletal structures are unremarkable. IMPRESSION: No active cardiopulmonary disease. Electronically Signed   By: Tollie Ethavid  Kwon M.D.   On: 11/15/2018 13:42    Procedures Procedures (including critical care time)  Medications Ordered in ED Medications  aspirin chewable tablet 324 mg (324 mg Oral Given 11/15/18 1331)     Initial Impression / Assessment and Plan / ED Course  I have reviewed the triage vital signs and the nursing notes.  Pertinent labs & imaging results that were available during my care of the patient were reviewed by me and considered in my medical decision making (see chart for details).  On repeat exam the patient is awake and alert, in no distress, no ongoing symptoms.  We discussed all findings, including reassuring labs, x-ray, EKG No evidence for ACS, PE, pneumonia. With no distal neurologic compromise, some suspicion for radiculopathy. Given his reassuring findings, the patient will follow closely with outpatient providers including cardiology.   Final Clinical  Impressions(s) / ED Diagnoses   Final diagnoses:  Atypical chest pain  Right arm pain    ED Discharge Orders         Ordered    predniSONE (DELTASONE) 20 MG tablet  Daily with breakfast     11/15/18 1521           Gerhard MunchLockwood, Shaletha Humble, MD 11/15/18 1625

## 2018-11-15 NOTE — ED Notes (Signed)
Pt reports new s/s to Dr. Jeraldine LootsLockwood upon initial examination, stating he has had left chest pressure/pain for the past few days, unsure when this started.   CP workup initiated, acuity updated.

## 2019-07-01 ENCOUNTER — Other Ambulatory Visit: Payer: Self-pay

## 2019-07-01 DIAGNOSIS — Z20822 Contact with and (suspected) exposure to covid-19: Secondary | ICD-10-CM

## 2019-07-02 LAB — NOVEL CORONAVIRUS, NAA: SARS-CoV-2, NAA: NOT DETECTED

## 2019-07-06 ENCOUNTER — Telehealth: Payer: Self-pay | Admitting: General Practice

## 2019-07-06 NOTE — Telephone Encounter (Signed)
Negative result was given to ;t

## 2019-07-18 ENCOUNTER — Other Ambulatory Visit: Payer: Self-pay

## 2019-07-18 ENCOUNTER — Emergency Department (HOSPITAL_COMMUNITY): Payer: Worker's Compensation

## 2019-07-18 ENCOUNTER — Encounter (HOSPITAL_COMMUNITY): Payer: Self-pay

## 2019-07-18 ENCOUNTER — Emergency Department (HOSPITAL_COMMUNITY)
Admission: EM | Admit: 2019-07-18 | Discharge: 2019-07-18 | Disposition: A | Discharge status: Home / Self Care | Payer: Worker's Compensation | Attending: Emergency Medicine | Admitting: Emergency Medicine

## 2019-07-18 DIAGNOSIS — Y939 Activity, unspecified: Secondary | ICD-10-CM | POA: Diagnosis not present

## 2019-07-18 DIAGNOSIS — S39012A Strain of muscle, fascia and tendon of lower back, initial encounter: Secondary | ICD-10-CM | POA: Diagnosis not present

## 2019-07-18 DIAGNOSIS — Y92410 Unspecified street and highway as the place of occurrence of the external cause: Secondary | ICD-10-CM | POA: Diagnosis not present

## 2019-07-18 DIAGNOSIS — Y999 Unspecified external cause status: Secondary | ICD-10-CM | POA: Insufficient documentation

## 2019-07-18 DIAGNOSIS — S161XXA Strain of muscle, fascia and tendon at neck level, initial encounter: Secondary | ICD-10-CM | POA: Insufficient documentation

## 2019-07-18 DIAGNOSIS — Z79899 Other long term (current) drug therapy: Secondary | ICD-10-CM | POA: Insufficient documentation

## 2019-07-18 DIAGNOSIS — I10 Essential (primary) hypertension: Secondary | ICD-10-CM | POA: Insufficient documentation

## 2019-07-18 DIAGNOSIS — S199XXA Unspecified injury of neck, initial encounter: Secondary | ICD-10-CM | POA: Diagnosis present

## 2019-07-18 DIAGNOSIS — M1990 Unspecified osteoarthritis, unspecified site: Secondary | ICD-10-CM

## 2019-07-18 MED ORDER — NAPROXEN 250 MG PO TABS
500.0000 mg | ORAL_TABLET | Freq: Once | ORAL | Status: AC
  Administered 2019-07-18: 500 mg via ORAL
  Filled 2019-07-18: qty 2
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
## 2019-07-18 NOTE — ED Notes (Signed)
Patient transported to X-ray 
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
## 2019-10-17 DIAGNOSIS — R519 Headache, unspecified: Secondary | ICD-10-CM | POA: Insufficient documentation

## 2019-10-17 DIAGNOSIS — H9313 Tinnitus, bilateral: Secondary | ICD-10-CM | POA: Insufficient documentation

## 2019-10-17 DIAGNOSIS — H903 Sensorineural hearing loss, bilateral: Secondary | ICD-10-CM | POA: Insufficient documentation

## 2019-10-17 DIAGNOSIS — R2689 Other abnormalities of gait and mobility: Secondary | ICD-10-CM | POA: Insufficient documentation

## 2019-10-20 ENCOUNTER — Other Ambulatory Visit: Payer: Self-pay | Admitting: Otolaryngology

## 2019-10-20 DIAGNOSIS — H9313 Tinnitus, bilateral: Secondary | ICD-10-CM

## 2019-10-20 DIAGNOSIS — R519 Headache, unspecified: Secondary | ICD-10-CM

## 2019-10-20 DIAGNOSIS — R2689 Other abnormalities of gait and mobility: Secondary | ICD-10-CM

## 2019-11-04 ENCOUNTER — Ambulatory Visit
Admission: RE | Admit: 2019-11-04 | Discharge: 2019-11-04 | Disposition: A | Discharge status: Home / Self Care | Payer: PRIVATE HEALTH INSURANCE | Source: Ambulatory Visit | Attending: Otolaryngology | Admitting: Otolaryngology

## 2019-11-04 ENCOUNTER — Other Ambulatory Visit: Payer: Self-pay

## 2019-11-04 DIAGNOSIS — R2689 Other abnormalities of gait and mobility: Secondary | ICD-10-CM

## 2019-11-04 DIAGNOSIS — R519 Headache, unspecified: Secondary | ICD-10-CM

## 2019-11-04 DIAGNOSIS — H9313 Tinnitus, bilateral: Secondary | ICD-10-CM

## 2019-11-04 MED ORDER — GADOBENATE DIMEGLUMINE 529 MG/ML IV SOLN
18.0000 mL | Freq: Once | INTRAVENOUS | Status: AC | PRN
  Administered 2019-11-04: 18 mL via INTRAVENOUS

## 2019-11-10 ENCOUNTER — Encounter: Payer: Self-pay | Admitting: Neurology

## 2019-11-10 ENCOUNTER — Ambulatory Visit (INDEPENDENT_AMBULATORY_CARE_PROVIDER_SITE_OTHER): Payer: PRIVATE HEALTH INSURANCE | Admitting: Neurology

## 2019-11-10 ENCOUNTER — Other Ambulatory Visit: Payer: Self-pay

## 2019-11-10 VITALS — BP 129/82 | HR 79 | Ht 71.0 in | Wt 190.0 lb

## 2019-11-10 DIAGNOSIS — S065X9A Traumatic subdural hemorrhage with loss of consciousness of unspecified duration, initial encounter: Secondary | ICD-10-CM | POA: Diagnosis not present

## 2019-11-10 DIAGNOSIS — G9600 Cerebrospinal fluid leak, unspecified: Secondary | ICD-10-CM | POA: Diagnosis not present

## 2019-11-10 DIAGNOSIS — S065XAA Traumatic subdural hemorrhage with loss of consciousness status unknown, initial encounter: Secondary | ICD-10-CM
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
## 2019-11-15 ENCOUNTER — Telehealth: Payer: Self-pay | Admitting: Neurology

## 2019-11-15 ENCOUNTER — Other Ambulatory Visit (INDEPENDENT_AMBULATORY_CARE_PROVIDER_SITE_OTHER): Payer: PRIVATE HEALTH INSURANCE

## 2019-11-15 ENCOUNTER — Other Ambulatory Visit: Payer: PRIVATE HEALTH INSURANCE

## 2019-11-15 ENCOUNTER — Other Ambulatory Visit: Payer: Self-pay

## 2019-11-15 DIAGNOSIS — R519 Headache, unspecified: Secondary | ICD-10-CM | POA: Diagnosis not present

## 2019-11-15 DIAGNOSIS — R6889 Other general symptoms and signs: Secondary | ICD-10-CM

## 2019-11-15 NOTE — Telephone Encounter (Signed)
Patient called to check on the status of the request. He is aware it is in process.

## 2019-11-15 NOTE — Telephone Encounter (Signed)
The following message was left with [employer] on 11/15/19 at 12:14 PM.  Caller states he is needing a creatinine lab drawn prior to an MRI he has scheduled for this coming Sunday at 8:30 AM. He was told that Dr. Tomi Likens needed to order the lab so he scheduled an appointment at 4:00 PM today to have the lab drawn but was told by someone in the office that [facility] would need to order it but [facility] is saying Dr. Tomi Likens has to order it.  If he doesn't get the lab drawn, they will not do the MRI.

## 2019-11-15 NOTE — Telephone Encounter (Signed)
See below Ok to order Never had to order prior to MRI before

## 2019-11-16 LAB — CREATININE, SERUM: Creatinine, Ser: 1.02 mg/dL (ref 0.40–1.50)

## 2019-11-19 ENCOUNTER — Ambulatory Visit (HOSPITAL_COMMUNITY)
Admission: RE | Admit: 2019-11-19 | Discharge: 2019-11-19 | Disposition: A | Discharge status: Home / Self Care | Payer: PRIVATE HEALTH INSURANCE | Source: Ambulatory Visit | Attending: Neurology | Admitting: Neurology

## 2019-11-19 ENCOUNTER — Other Ambulatory Visit: Payer: Self-pay

## 2019-11-19 DIAGNOSIS — G9600 Cerebrospinal fluid leak, unspecified: Secondary | ICD-10-CM

## 2019-11-19 DIAGNOSIS — S065X9A Traumatic subdural hemorrhage with loss of consciousness of unspecified duration, initial encounter: Secondary | ICD-10-CM | POA: Diagnosis present

## 2019-11-19 DIAGNOSIS — S065XAA Traumatic subdural hemorrhage with loss of consciousness status unknown, initial encounter: Secondary | ICD-10-CM

## 2019-11-19 MED ORDER — GADOBUTROL 1 MMOL/ML IV SOLN
9.0000 mL | Freq: Once | INTRAVENOUS | Status: AC | PRN
  Administered 2019-11-19: 09:00:00 9 mL via INTRAVENOUS

## 2019-11-20 ENCOUNTER — Other Ambulatory Visit: Payer: Self-pay

## 2019-11-20 ENCOUNTER — Telehealth: Payer: Self-pay | Admitting: Neurology

## 2019-11-20 DIAGNOSIS — R519 Headache, unspecified: Secondary | ICD-10-CM

## 2019-11-20 DIAGNOSIS — R2689 Other abnormalities of gait and mobility: Secondary | ICD-10-CM

## 2019-11-20 NOTE — Telephone Encounter (Signed)
Left message on voicemail that creatine kidney labs have been normal.

## 2019-11-20 NOTE — Telephone Encounter (Signed)
Patient left msg with after hours about having 4 scans with contrast today at 8AM and wanting to know if his kidneys can handle this? Thanks!

## 2019-11-29 ENCOUNTER — Ambulatory Visit
Admission: RE | Admit: 2019-11-29 | Discharge: 2019-11-29 | Disposition: A | Discharge status: Home / Self Care | Payer: PRIVATE HEALTH INSURANCE | Source: Ambulatory Visit | Attending: Neurology | Admitting: Neurology

## 2019-11-29 ENCOUNTER — Other Ambulatory Visit (HOSPITAL_COMMUNITY)
Admission: RE | Admit: 2019-11-29 | Discharge: 2019-11-29 | Disposition: A | Discharge status: Home / Self Care | Payer: PRIVATE HEALTH INSURANCE | Source: Ambulatory Visit | Attending: Neurology | Admitting: Neurology

## 2019-11-29 ENCOUNTER — Other Ambulatory Visit: Payer: Self-pay

## 2019-11-29 VITALS — BP 129/84 | HR 66

## 2019-11-29 DIAGNOSIS — R519 Headache, unspecified: Secondary | ICD-10-CM | POA: Insufficient documentation

## 2019-11-29 DIAGNOSIS — R2689 Other abnormalities of gait and mobility: Secondary | ICD-10-CM

## 2019-11-29 DIAGNOSIS — H903 Sensorineural hearing loss, bilateral: Secondary | ICD-10-CM | POA: Insufficient documentation

## 2019-11-29 NOTE — Discharge Instructions (Signed)

## 2019-11-30 LAB — CYTOLOGY - NON PAP

## 2019-12-08 ENCOUNTER — Other Ambulatory Visit: Payer: Self-pay | Admitting: Neurosurgery

## 2019-12-08 ENCOUNTER — Other Ambulatory Visit (HOSPITAL_COMMUNITY): Payer: Self-pay | Admitting: Neurosurgery

## 2019-12-12 ENCOUNTER — Other Ambulatory Visit: Payer: Self-pay | Admitting: Neurosurgery

## 2019-12-12 DIAGNOSIS — G9601 Cranial cerebrospinal fluid leak, spontaneous: Secondary | ICD-10-CM

## 2019-12-13 ENCOUNTER — Ambulatory Visit
Admission: RE | Admit: 2019-12-13 | Discharge: 2019-12-13 | Disposition: A | Discharge status: Home / Self Care | Payer: PRIVATE HEALTH INSURANCE | Source: Ambulatory Visit | Attending: Neurosurgery | Admitting: Neurosurgery

## 2019-12-13 ENCOUNTER — Other Ambulatory Visit: Payer: Self-pay

## 2019-12-13 ENCOUNTER — Ambulatory Visit
Admission: RE | Admit: 2019-12-13 | Discharge: 2019-12-13 | Disposition: A | Discharge status: Home / Self Care | Payer: Self-pay | Source: Ambulatory Visit | Attending: Neurosurgery | Admitting: Neurosurgery

## 2019-12-13 VITALS — BP 116/74 | HR 65

## 2019-12-13 DIAGNOSIS — G9601 Cranial cerebrospinal fluid leak, spontaneous: Secondary | ICD-10-CM

## 2019-12-13 DIAGNOSIS — R519 Headache, unspecified: Secondary | ICD-10-CM

## 2019-12-13 DIAGNOSIS — H9313 Tinnitus, bilateral: Secondary | ICD-10-CM

## 2019-12-13 MED ORDER — DIAZEPAM 5 MG PO TABS
5.0000 mg | ORAL_TABLET | Freq: Once | ORAL | Status: AC
  Administered 2019-12-13: 5 mg via ORAL

## 2019-12-13 MED ORDER — IOPAMIDOL (ISOVUE-M 300) INJECTION 61%
10.0000 mL | Freq: Once | INTRAMUSCULAR | Status: AC | PRN
  Administered 2019-12-13: 10 mL via INTRATHECAL

## 2019-12-13 MED ORDER — DIAZEPAM 5 MG PO TABS: 5.0000 mg | ORAL_TABLET | Freq: Once | ORAL | Status: DC

## 2019-12-13 MED ORDER — DIAZEPAM 5 MG PO TABS: 10.0000 mg | ORAL_TABLET | Freq: Once | ORAL | Status: DC

## 2019-12-13 NOTE — Discharge Instructions (Signed)

## 2019-12-22 ENCOUNTER — Encounter: Payer: Self-pay | Admitting: Neurology

## 2019-12-22 ENCOUNTER — Other Ambulatory Visit: Payer: PRIVATE HEALTH INSURANCE

## 2019-12-27 ENCOUNTER — Other Ambulatory Visit: Payer: Self-pay | Admitting: Neurosurgery

## 2019-12-27 DIAGNOSIS — G96810 Intracranial hypotension, unspecified: Secondary | ICD-10-CM

## 2019-12-28 LAB — LEUKEMIA/LYMPHOMA EVALUATION PANEL
NUMBER OF MARKERS:: 14
VIABILITY:: 50 %

## 2019-12-28 LAB — CNS IGG SYNTHESIS RATE, CSF+BLOOD
Albumin Serum: 5 g/dL — ABNORMAL HIGH (ref 3.2–4.6)
Albumin, CSF: 34.2 mg/dL (ref 8.0–42.0)
CNS-IgG Synthesis Rate: -1 mg/24 h (ref <=3.3)
IgG (Immunoglobin G), Serum: 1320 mg/dL (ref 600–1540)
IgG Total CSF: 4.8 mg/dL (ref 0.8–7.7)
IgG-Index: 0.53 (ref <0.66)

## 2019-12-28 LAB — BORRELIA SPECIES DNA, FLUID, PCR: B. burgdorferi DNA: NOT DETECTED

## 2019-12-28 LAB — ANGIOTENSIN CONVERTING ENZYME, CSF: ACE, CSF: 10 U/L (ref <=15)

## 2019-12-28 LAB — CRYPTOCOCCAL AG, LTX SCR RFLX TITER
Cryptococcal Ag Screen: NOT DETECTED
MICRO NUMBER:: 1240866
SPECIMEN QUALITY:: ADEQUATE

## 2019-12-28 LAB — OLIGOCLONAL BANDS, CSF + SERM

## 2019-12-28 LAB — GLUCOSE, CSF: Glucose, CSF: 59 mg/dL (ref 40–80)

## 2019-12-28 LAB — FUNGUS CULTURE W SMEAR
MICRO NUMBER:: 1240867
SMEAR:: NONE SEEN
SPECIMEN QUALITY:: ADEQUATE

## 2019-12-28 LAB — CSF CELL COUNT WITH DIFFERENTIAL
Basophils, %: 0 %
Lymphs, CSF: 72 % (ref 40–80)
Monocyte/Macrophage: 22 % (ref 15–45)
RBC Count, CSF: 138 cells/uL — ABNORMAL HIGH
Segmented Neutrophils-CSF: 6 % (ref 0–6)
WBC, CSF: 7 cells/uL — ABNORMAL HIGH (ref 0–5)

## 2019-12-28 LAB — PROTEIN, CSF: Total Protein, CSF: 67 mg/dL — ABNORMAL HIGH (ref 15–60)

## 2019-12-29 ENCOUNTER — Other Ambulatory Visit: Payer: Self-pay

## 2019-12-29 ENCOUNTER — Ambulatory Visit
Admission: RE | Admit: 2019-12-29 | Discharge: 2019-12-29 | Disposition: A | Discharge status: Home / Self Care | Payer: Worker's Compensation | Source: Ambulatory Visit | Attending: Neurosurgery | Admitting: Neurosurgery

## 2019-12-29 DIAGNOSIS — G96810 Intracranial hypotension, unspecified: Secondary | ICD-10-CM

## 2019-12-29 MED ORDER — IOPAMIDOL (ISOVUE-M 200) INJECTION 41%
1.0000 mL | Freq: Once | INTRAMUSCULAR | Status: AC
  Administered 2019-12-29: 12:00:00 1 mL via EPIDURAL

## 2019-12-29 NOTE — Discharge Instructions (Signed)

## 2020-01-09 ENCOUNTER — Ambulatory Visit: Payer: PRIVATE HEALTH INSURANCE | Admitting: Neurology

## 2020-02-03 ENCOUNTER — Ambulatory Visit: Payer: Self-pay | Attending: Internal Medicine

## 2020-02-03 DIAGNOSIS — Z23 Encounter for immunization: Secondary | ICD-10-CM | POA: Insufficient documentation

## 2020-02-03 NOTE — Progress Notes (Signed)
   Covid-19 Vaccination Clinic  Name:  GAINES CARTMELL    MRN: [id number] DOB: June 30, 1956  02/03/2020  Mr. Dutko was observed post Covid-19 immunization for 15 minutes without incident. He was provided with Vaccine Information Sheet and instruction to access the V-Safe system.   Mr. Morgan was instructed to call 911 with any severe reactions post vaccine: Marland Kitchen Difficulty breathing  . Swelling of face and throat  . A fast heartbeat  . A bad rash all over body  . Dizziness and weakness   Immunizations Administered    Name Date Dose VIS Date Route   Moderna COVID-19 Vaccine 02/03/2020 10:07 AM 0.5 mL 10/31/2019 Intramuscular   Manufacturer: Moderna   Lot: [id number]   NDC: 58527-782-42

## 2020-03-06 ENCOUNTER — Ambulatory Visit: Payer: Self-pay | Attending: Internal Medicine

## 2020-03-06 DIAGNOSIS — Z23 Encounter for immunization: Secondary | ICD-10-CM

## 2020-03-06 NOTE — Progress Notes (Signed)
   Covid-19 Vaccination Clinic  Name:  NYAIRE DENBLEYKER    MRN: [id number] DOB: 14-Apr-1956  03/06/2020  Mr. Fullwood was observed post Covid-19 immunization for 15 minutes without incident. He was provided with Vaccine Information Sheet and instruction to access the V-Safe system.   Mr. Chait was instructed to call 911 with any severe reactions post vaccine: Marland Kitchen Difficulty breathing  . Swelling of face and throat  . A fast heartbeat  . A bad rash all over body  . Dizziness and weakness   Immunizations Administered    Name Date Dose VIS Date Route   Moderna COVID-19 Vaccine 03/06/2020 11:23 AM 0.5 mL 10/31/2019 Intramuscular   Manufacturer: Gala Murdoch   Lot: [id number]   NDC: 11941-740-81

## 2020-03-07 ENCOUNTER — Ambulatory Visit: Payer: Self-pay

## 2020-06-13 IMAGING — CT CT MAXILLOFACIAL W/ CM
4 of 6 series · 13 of 30 positions shown, 14 images · non-contrast
Comparison: MRI 11/19/2019

CLINICAL DATA: Presume CSF leak. Fluid from the nose. Assess for
skull base leak.

EXAM:
CT MAXILLOFACIAL WITHOUT CONTRAST, cisternography
TECHNIQUE: Multidetector CT imaging of the maxillofacial structures was
performed. Multiplanar CT image reconstructions were also generated.

[Series 6: cor prone-soft · axial · 0.27mm/px · z∈[+84,+157]mm · 4 of 75 slices shown (1 of 2)]
[im 15/75  brain]
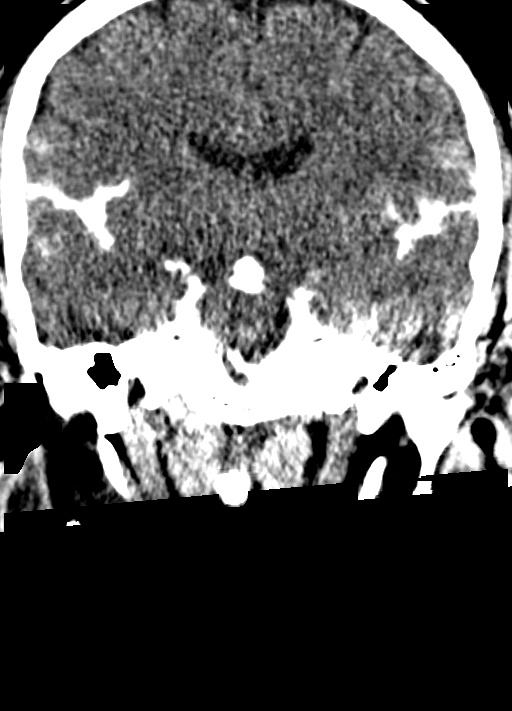
[im 30/75  brain]
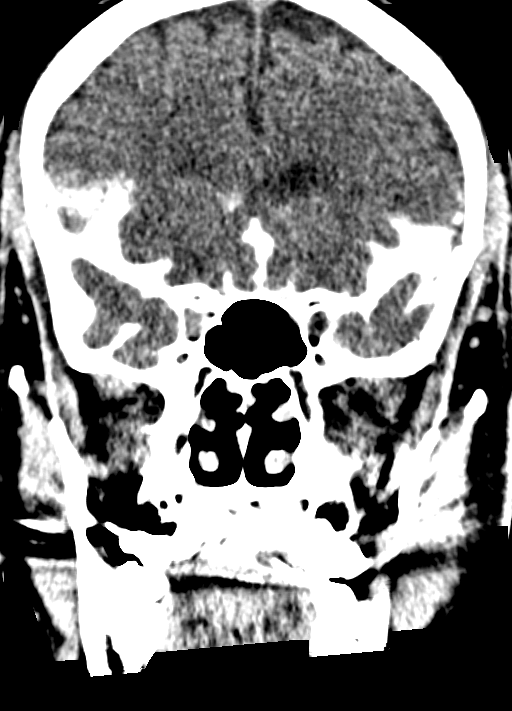
[im 45/75  brain]
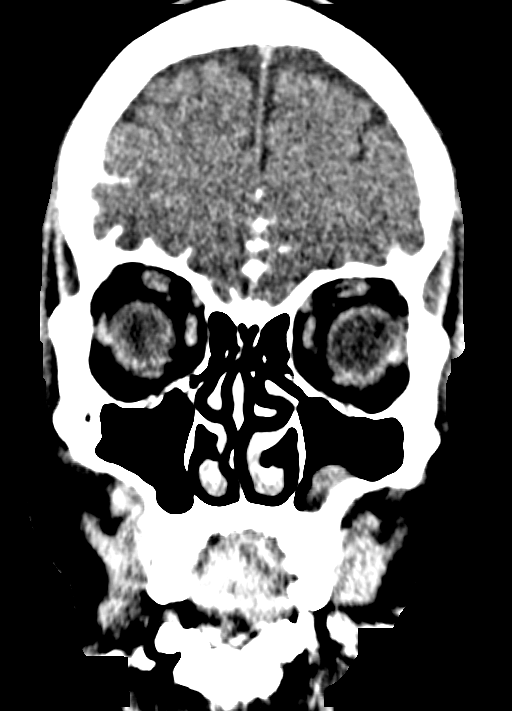
[im 60/75  brain]
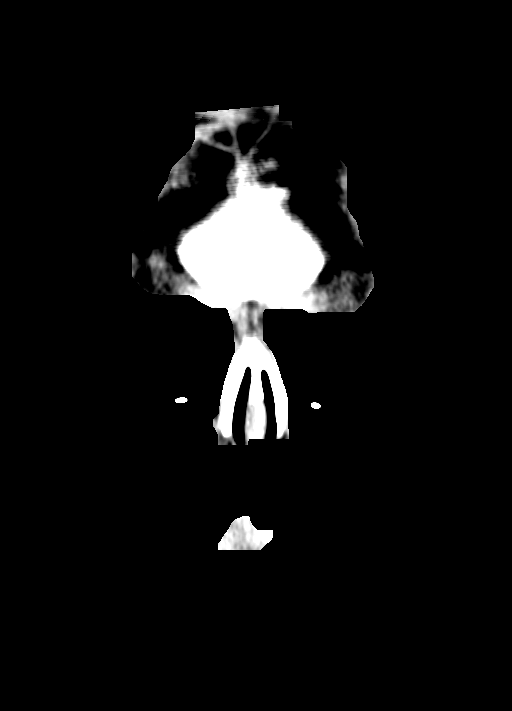

[Series 13: maxofacial -prone · axial · 0.32mm/px · z∈[+40,+82]mm · 2 of 43 slices shown]
[im 15/43  bone]
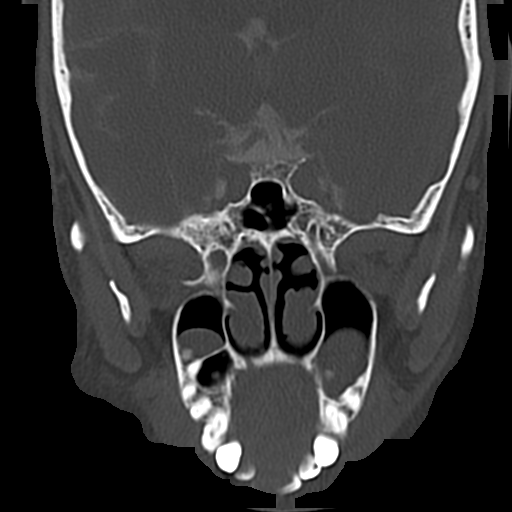
[im 29/43  bone]
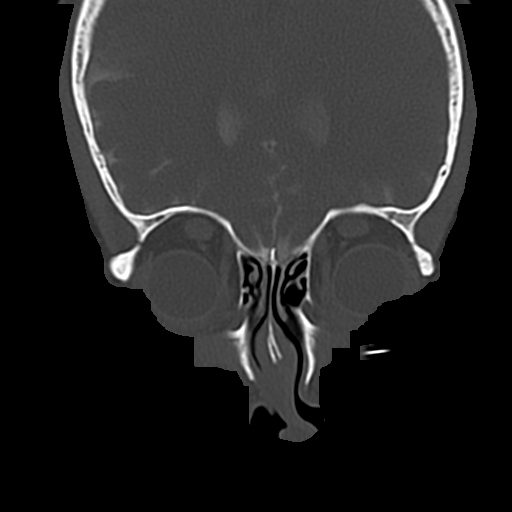

[Series 15: cor prone · axial · 0.30mm/px · z∈[+75,+127]mm · 3 of 66 slices shown, 4 images]
[im 17/66  brain]
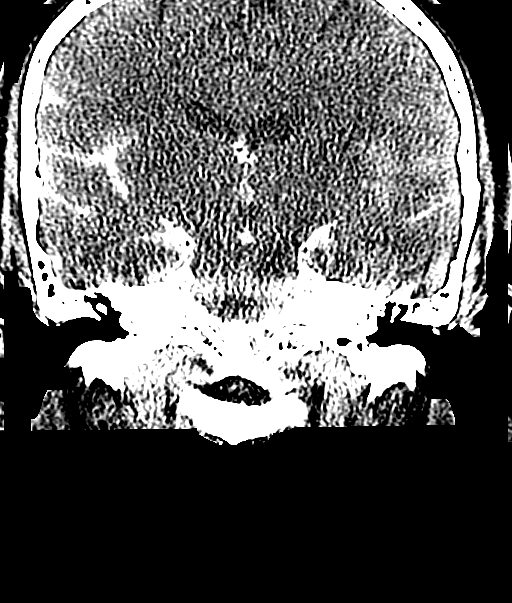
[im 17/66  bone]
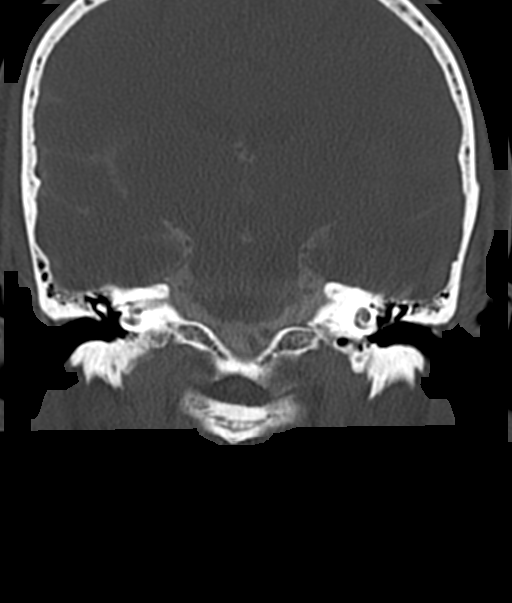
[im 33/66  bone]
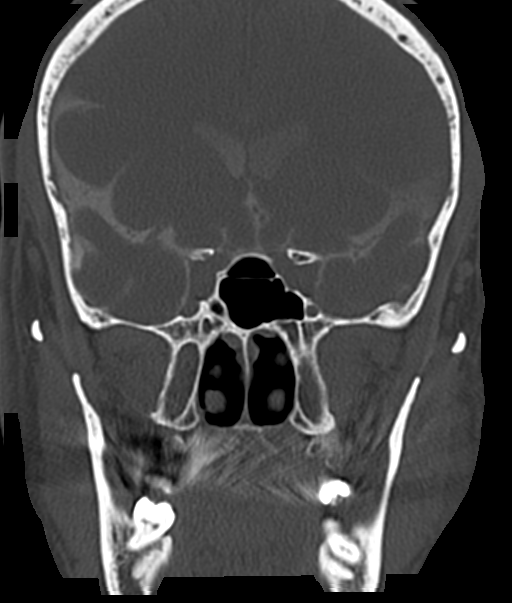
[im 49/66  bone]
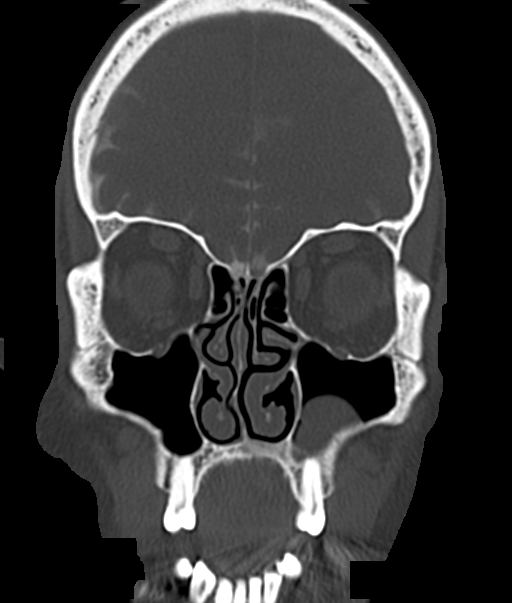

[Series 16: cor prone-soft · axial · 0.30mm/px · z∈[+70,+135]mm · 4 of 68 slices shown (2 of 2)]
[im 14/68  brain]
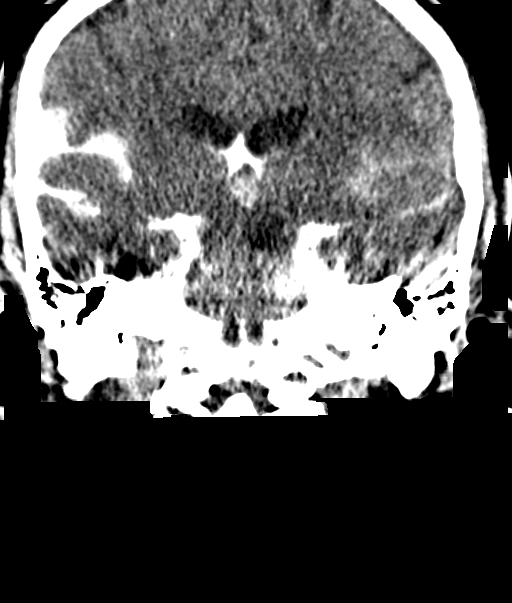
[im 27/68  brain]
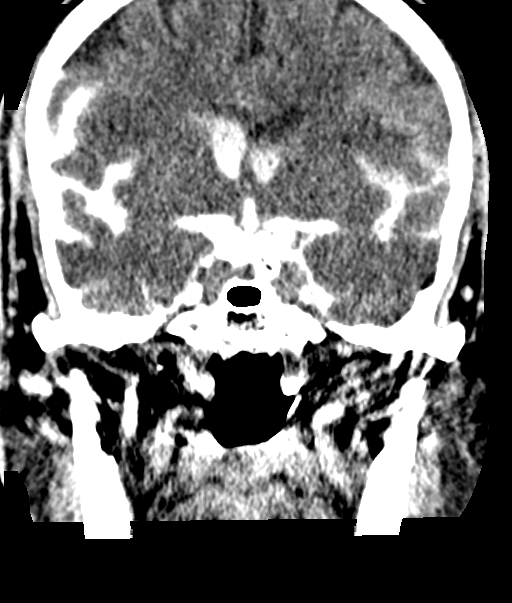
[im 41/68  brain]
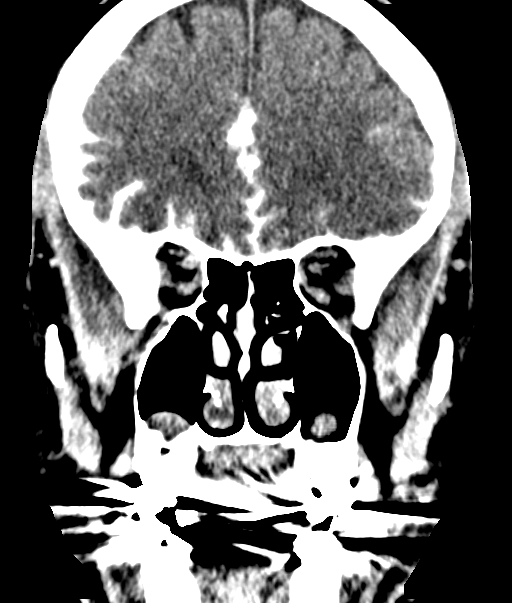
[im 54/68  brain]
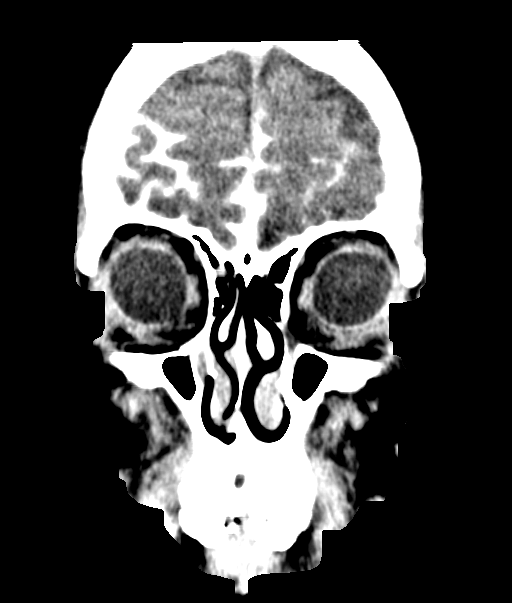

[13 of 30 positions shown; findings below may reference images not displayed]

FINDINGS: Pre-injection scan shows retention cysts at the floors of the
maxillary sinuses. No evidence of inflammatory sinus disease. No
mucosal thickening or fluid opacification. Linear lucency noted
along the floor of the anterior cranial fossa to the left of midline
on coronal reconstructed series 7, image 34. No underlying fluid
however.

After contrast was instilled into the thecal sac, scanning was
performed at 5 minutes and 30 minutes. There is no conclusive CSF
leak demonstrated. There is slightly decreased contrast opacity in
the olfactory recess on the left compared to the right, but I think
this relates to patient positioning as there is in general slightly
less contrast on the left anteriorly. Again, no fluid of any sort is
seen within the sinuses presently. The linear lucency on the left is
redemonstrated, series 5 coronal image 40, but is not seemingly
associated with any leak.
IMPRESSION: Good quality CSF cisternography with early and late scans performed.
No leak is demonstrated. No skull base defect is demonstrated.

A linear lucency at the floor of the anterior cranial fossa as
described above and marked with arrows does not seem to be
associated with any leak and is felt perhaps to be a vascular
groove.

## 2020-06-13 IMAGING — XA DG MYELOGRAPHY LUMBAR INJ LUMBOSACRAL
2 series · 2 of 2 positions shown · non-contrast
Comparison: none

CLINICAL DATA: Contrast injection for cisternography. Patient with
presumed CSF leak.

[Series 1: vasc standard · 1 of 1 slices shown (1 of 2)]
[im 1/1]
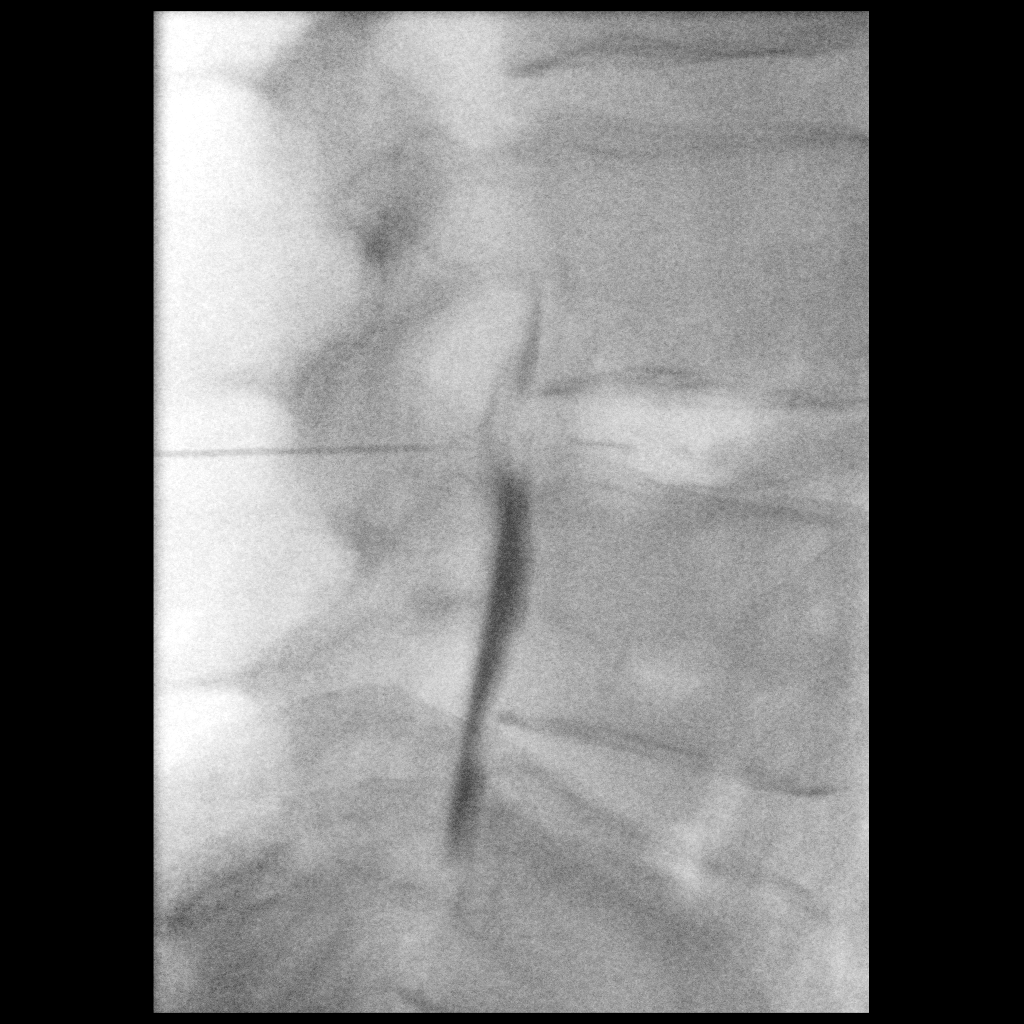

[Series 2: vasc standard · 1 of 1 slices shown (2 of 2)]
[im 1/1]
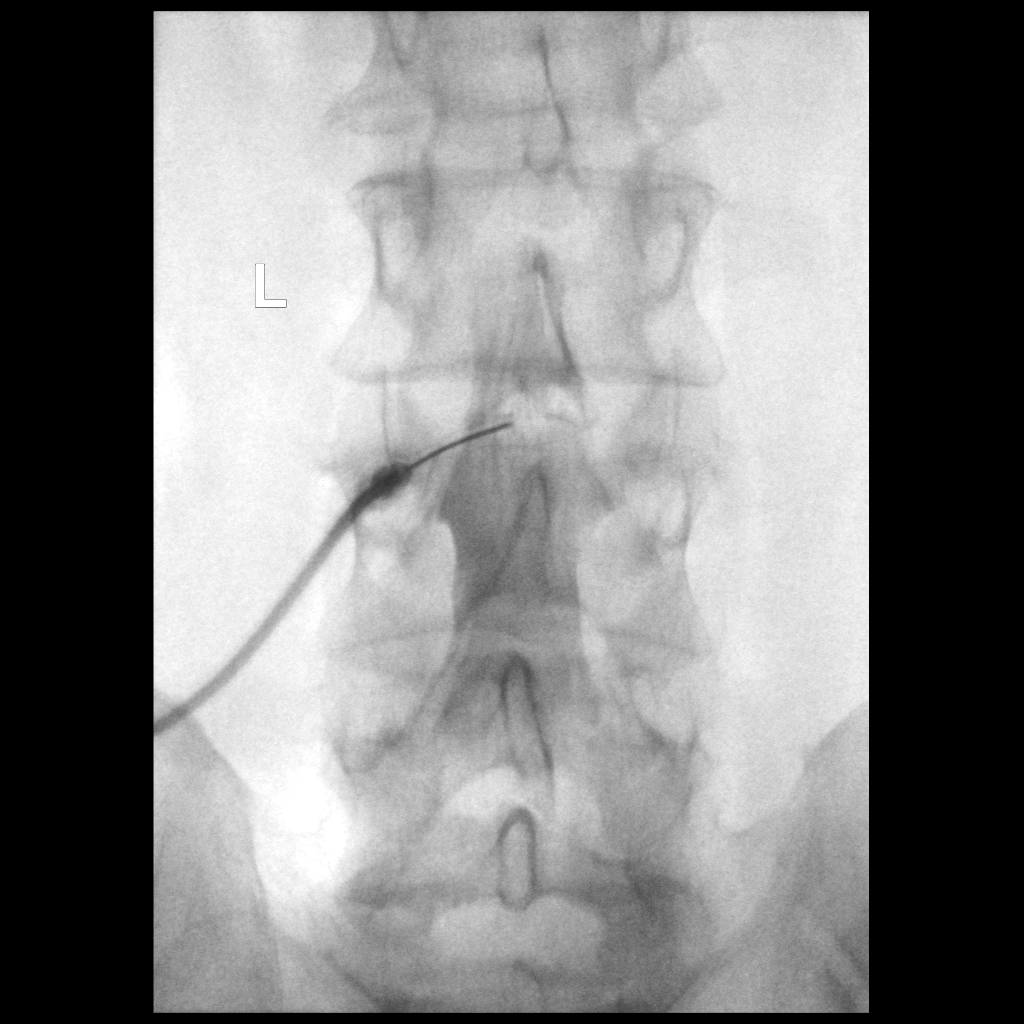

[2 of 2 positions shown; findings below may reference images not displayed]

EXAM:
Intrathecal contrast injection

FLUOROSCOPY TIME:  1 minutes 14 seconds. 216.63 micro gray meter
squared

PROCEDURE:
After thorough discussion of risks and benefits of the procedure
including bleeding, infection, injury to nerves, blood vessels,
adjacent structures as well as headache and CSF leak, written and
oral informed consent was obtained. Consent was obtained by Dr. Vaheed
Shaunta. Time out form was completed.

Patient was positioned prone on the fluoroscopy table. Local
anesthesia was provided with 1% lidocaine without epinephrine after
prepped and draped in the usual sterile fashion. Puncture was
performed at left L3-4 using a 3 1/2 inch 22 gauge spinal needle via
left paramedian approach. Using a single pass through the dura, the
needle was placed within the thecal sac, with return of clear CSF.
10 cc of Isovue 300 was injected into the thecal sac, with normal
opacification of the nerve roots and cauda equina consistent with
free flow within the subarachnoid space.

Contrast was then directed into the head by Trendelenburg
positioning.
IMPRESSION: Intrathecal contrast injection for cisternography.

## 2020-06-13 IMAGING — CT CT MAXILLOFACIAL W/O CM
1 series · 15 of 30 positions shown, 19 images · non-contrast
Comparison: MRI 11/19/2019

CLINICAL DATA: Presume CSF leak. Fluid from the nose. Assess for
skull base leak.

EXAM:
CT MAXILLOFACIAL WITHOUT CONTRAST, cisternography
TECHNIQUE: Multidetector CT imaging of the maxillofacial structures was
performed. Multiplanar CT image reconstructions were also generated.

[Series 4: prone -soft · axial · 0.33mm/px · z∈[-132,-42]mm · 15 of 34 slices shown, 19 images]
[im 2/34  brain]
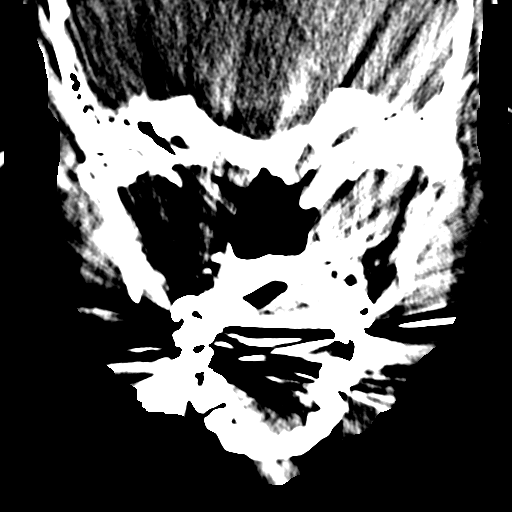
[im 2/34  bone]
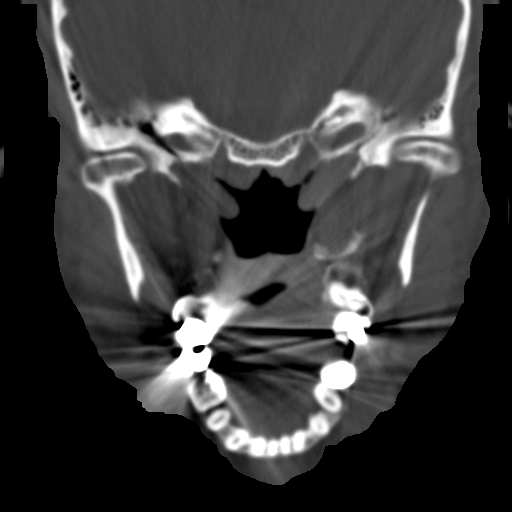
[im 4/34  bone]
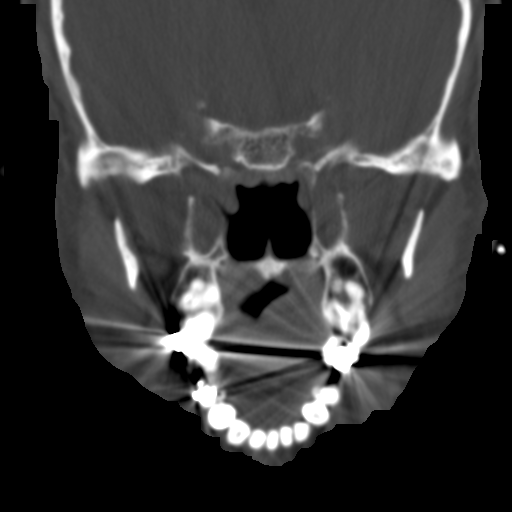
[im 6/34  bone]
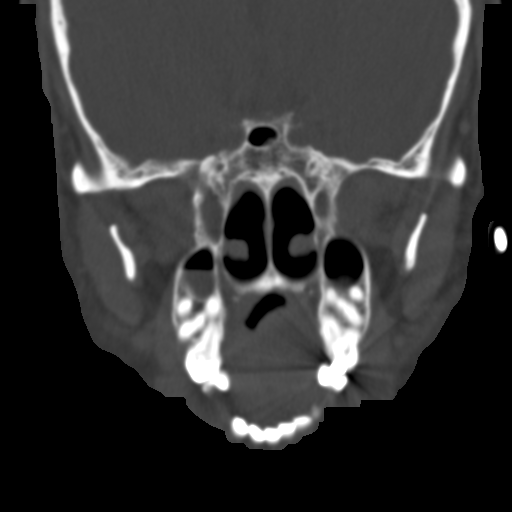
[im 8/34  bone]
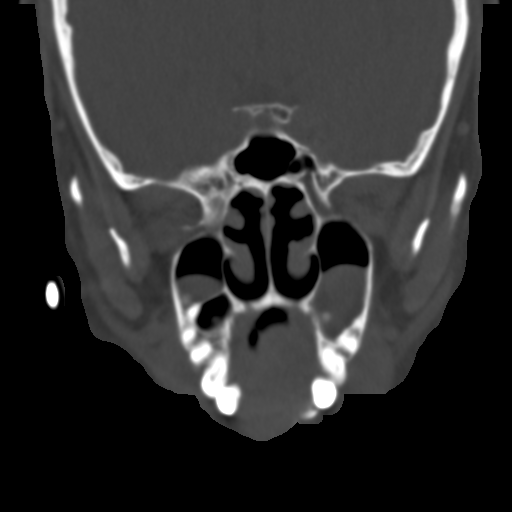
[im 11/34  brain]
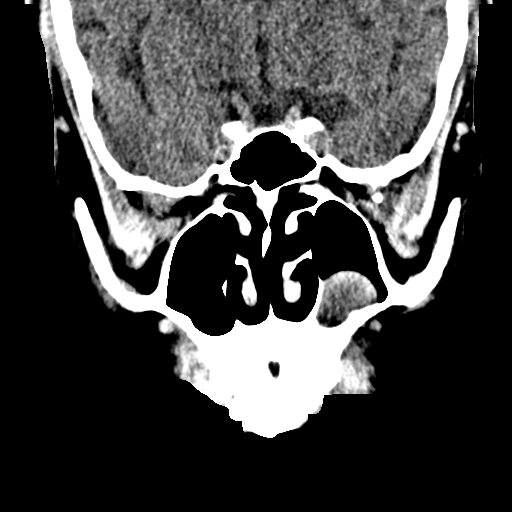
[im 11/34  bone]
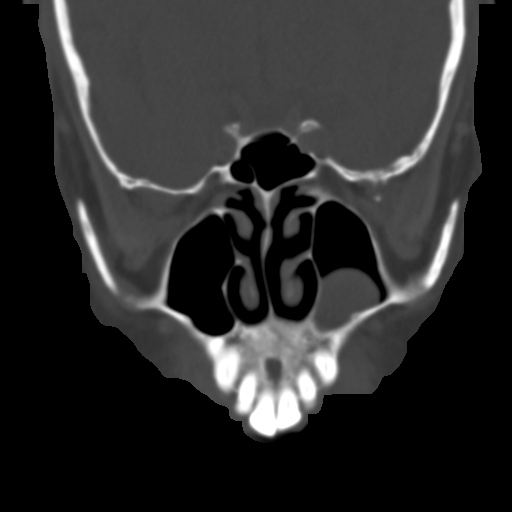
[im 13/34  bone]
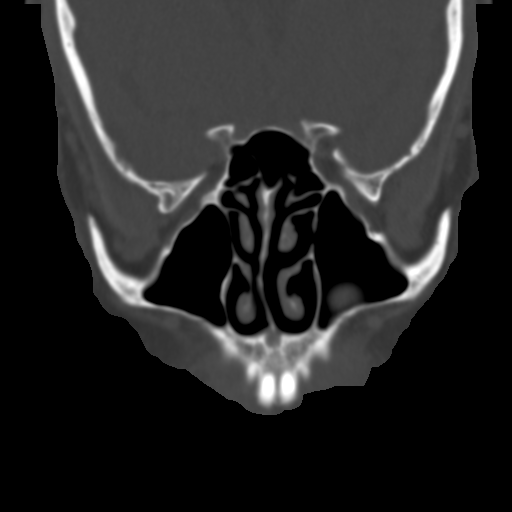
[im 15/34  bone]
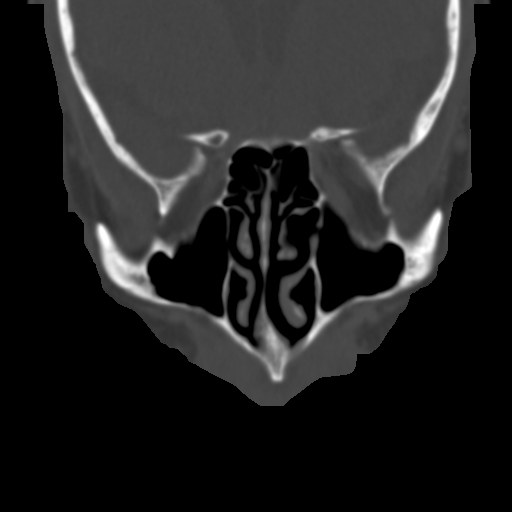
[im 18/34  bone]
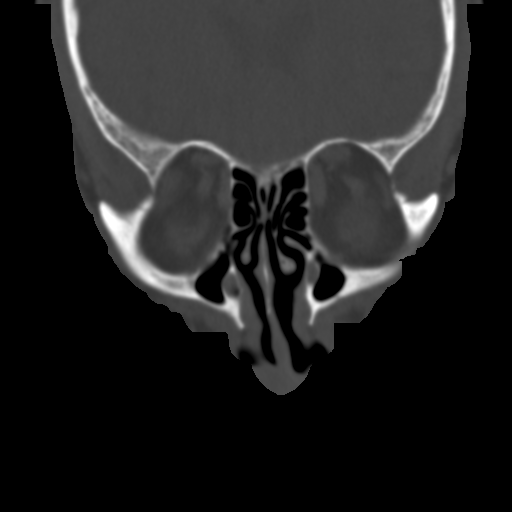
[im 19/34  brain]
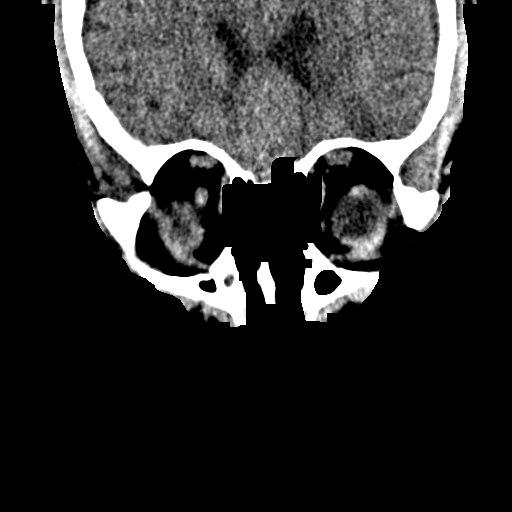
[im 19/34  bone]
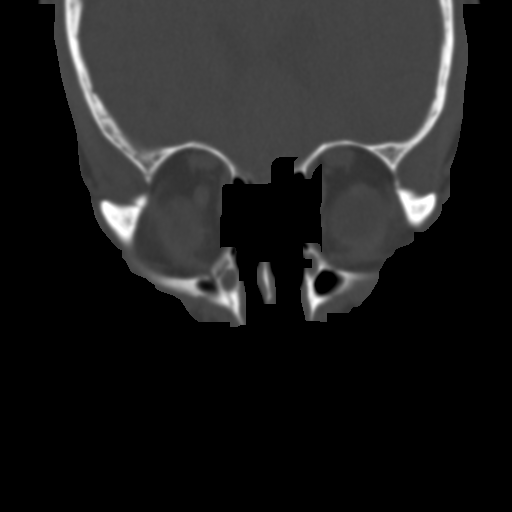
[im 21/34  bone]
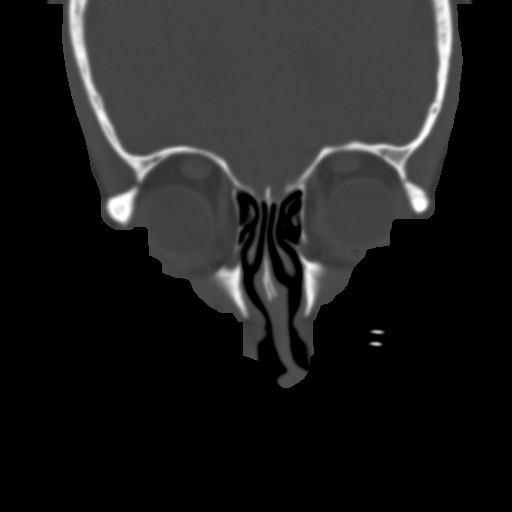
[im 23/34  bone]
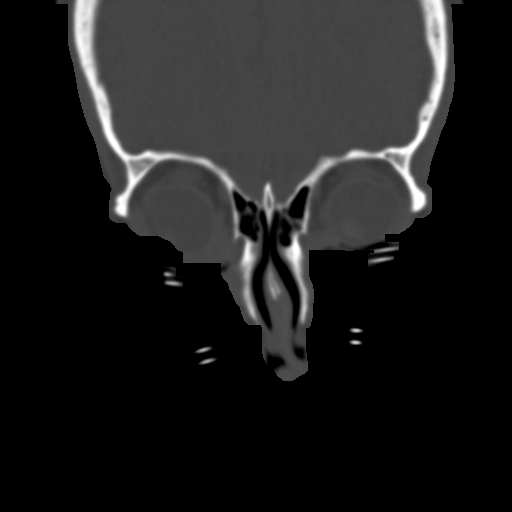
[im 26/34  bone]
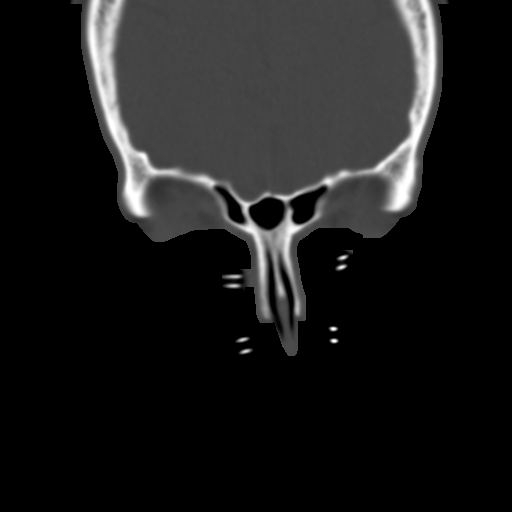
[im 28/34  brain]
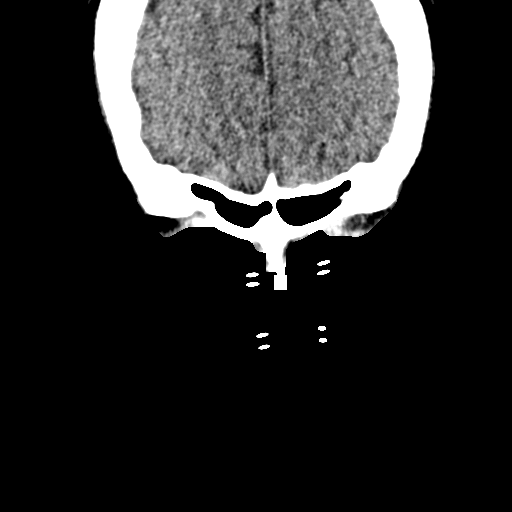
[im 28/34  bone]
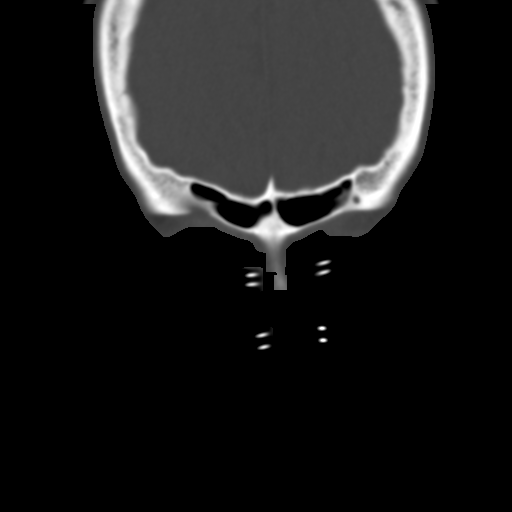
[im 30/34  bone]
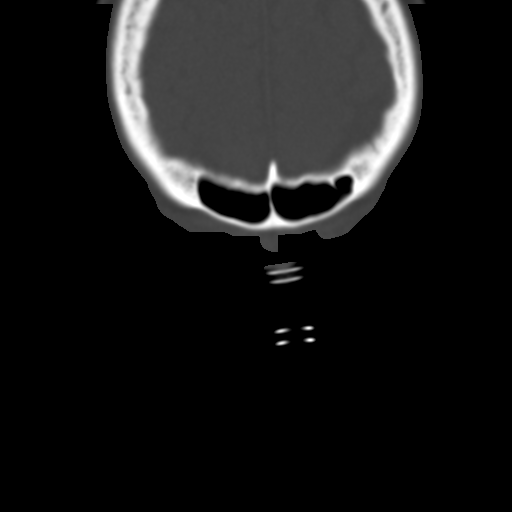
[im 32/34  bone]
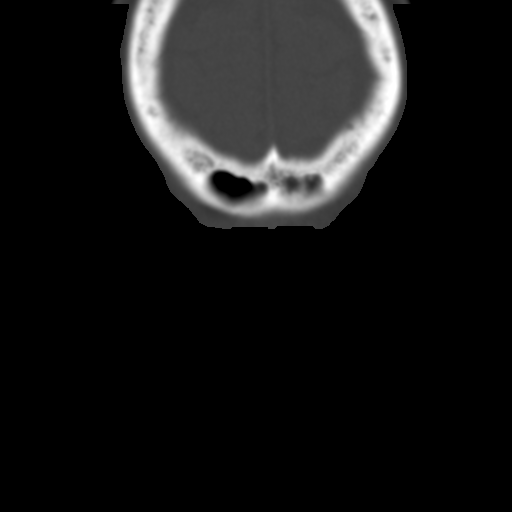

[15 of 30 positions shown; findings below may reference images not displayed]

FINDINGS: Pre-injection scan shows retention cysts at the floors of the
maxillary sinuses. No evidence of inflammatory sinus disease. No
mucosal thickening or fluid opacification. Linear lucency noted
along the floor of the anterior cranial fossa to the left of midline
on coronal reconstructed series 7, image 34. No underlying fluid
however.

After contrast was instilled into the thecal sac, scanning was
performed at 5 minutes and 30 minutes. There is no conclusive CSF
leak demonstrated. There is slightly decreased contrast opacity in
the olfactory recess on the left compared to the right, but I think
this relates to patient positioning as there is in general slightly
less contrast on the left anteriorly. Again, no fluid of any sort is
seen within the sinuses presently. The linear lucency on the left is
redemonstrated, series 5 coronal image 40, but is not seemingly
associated with any leak.
IMPRESSION: Good quality CSF cisternography with early and late scans performed.
No leak is demonstrated. No skull base defect is demonstrated.

A linear lucency at the floor of the anterior cranial fossa as
described above and marked with arrows does not seem to be
associated with any leak and is felt perhaps to be a vascular
groove.

## 2020-08-14 ENCOUNTER — Other Ambulatory Visit: Payer: Self-pay

## 2020-08-14 DIAGNOSIS — Z20822 Contact with and (suspected) exposure to covid-19: Secondary | ICD-10-CM

## 2020-08-16 LAB — NOVEL CORONAVIRUS, NAA: SARS-CoV-2, NAA: DETECTED — AB

## 2020-08-16 LAB — SARS-COV-2, NAA 2 DAY TAT

## 2020-08-17 ENCOUNTER — Other Ambulatory Visit: Payer: Self-pay | Admitting: Physician Assistant

## 2020-08-17 DIAGNOSIS — U071 COVID-19: Secondary | ICD-10-CM

## 2020-08-17 DIAGNOSIS — E663 Overweight: Secondary | ICD-10-CM

## 2020-08-17 DIAGNOSIS — I10 Essential (primary) hypertension: Secondary | ICD-10-CM

## 2020-08-17 NOTE — Progress Notes (Signed)
I connected by phone with Kevin Travis on 08/17/2020 at 10:26 AM to discuss the potential use of a new treatment for mild to moderate COVID-19 viral infection in non-hospitalized patients.  This patient is a 64 y.o. male that meets the FDA criteria for Emergency Use Authorization of COVID monoclonal antibody casirivimab/imdevimab.  Has a (+) direct SARS-CoV-2 viral test result  Has mild or moderate COVID-19   Is NOT hospitalized due to COVID-19  Is within 10 days of symptom onset  Has at least one of the high risk factor(s) for progression to severe COVID-19 and/or hospitalization as defined in EUA.  Specific high risk criteria : BMI > 25 and Cardiovascular disease or hypertension   I have spoken and communicated the following to the patient or parent/caregiver regarding COVID monoclonal antibody treatment:  1. FDA has authorized the emergency use for the treatment of mild to moderate COVID-19 in adults and pediatric patients with positive results of direct SARS-CoV-2 viral testing who are 40 years of age and older weighing at least 40 kg, and who are at high risk for progressing to severe COVID-19 and/or hospitalization.  2. The significant known and potential risks and benefits of COVID monoclonal antibody, and the extent to which such potential risks and benefits are unknown.  3. Information on available alternative treatments and the risks and benefits of those alternatives, including clinical trials.  4. Patients treated with COVID monoclonal antibody should continue to self-isolate and use infection control measures (e.g., wear mask, isolate, social distance, avoid sharing personal items, clean and disinfect "high touch" surfaces, and frequent handwashing) according to CDC guidelines.   5. The patient or parent/caregiver has the option to accept or refuse COVID monoclonal antibody treatment.  After reviewing this information with the patient, The patient agreed to proceed with  receiving casirivimab\imdevimab infusion and will be provided a copy of the Fact sheet prior to receiving the infusion.   Kevin Travis 08/17/2020 10:26 AM

## 2020-08-18 ENCOUNTER — Ambulatory Visit (HOSPITAL_COMMUNITY)
Admission: RE | Admit: 2020-08-18 | Discharge: 2020-08-18 | Disposition: A | Discharge status: Home / Self Care | Payer: PRIVATE HEALTH INSURANCE | Source: Ambulatory Visit | Attending: Pulmonary Disease | Admitting: Pulmonary Disease

## 2020-08-18 DIAGNOSIS — U071 COVID-19: Secondary | ICD-10-CM | POA: Diagnosis not present

## 2020-08-18 MED ORDER — FAMOTIDINE IN NACL 20-0.9 MG/50ML-% IV SOLN: 20.0000 mg | Freq: Once | INTRAVENOUS | Status: DC | PRN

## 2020-08-18 MED ORDER — SODIUM CHLORIDE 0.9 % IV SOLN: INTRAVENOUS | Status: DC | PRN

## 2020-08-18 MED ORDER — EPINEPHRINE 0.3 MG/0.3ML IJ SOAJ: 0.3000 mg | Freq: Once | INTRAMUSCULAR | Status: DC | PRN

## 2020-08-18 MED ORDER — SODIUM CHLORIDE 0.9 % IV SOLN
1200.0000 mg | Freq: Once | INTRAVENOUS | Status: AC
  Administered 2020-08-18: 1200 mg via INTRAVENOUS

## 2020-08-18 MED ORDER — DIPHENHYDRAMINE HCL 50 MG/ML IJ SOLN: 50.0000 mg | Freq: Once | INTRAMUSCULAR | Status: DC | PRN

## 2020-08-18 MED ORDER — METHYLPREDNISOLONE SODIUM SUCC 125 MG IJ SOLR: 125.0000 mg | Freq: Once | INTRAMUSCULAR | Status: DC | PRN

## 2020-08-18 MED ORDER — ALBUTEROL SULFATE HFA 108 (90 BASE) MCG/ACT IN AERS: 2.0000 | INHALATION_SPRAY | Freq: Once | RESPIRATORY_TRACT | Status: DC | PRN

## 2020-08-18 NOTE — Discharge Instructions (Signed)

## 2020-08-18 NOTE — Progress Notes (Signed)
°  Diagnosis: COVID-19 ° °Physician: Wright, MD ° °Procedure: Covid Infusion Clinic Med: casirivimab\imdevimab infusion - Provided patient with casirivimab\imdevimab fact sheet for patients, parents and caregivers prior to infusion. ° °Complications: No immediate complications noted. ° °Discharge: Discharged home  ° °Kevin Travis °08/18/2020 ° ° °

## 2020-08-18 NOTE — Progress Notes (Signed)
  Diagnosis: COVID-19  Physician: Dr. P Wright   Procedure: Covid Infusion Clinic Med: casirivimab\imdevimab infusion - Provided patient with casirivimab\imdevimab fact sheet for patients, parents and caregivers prior to infusion.  Complications: No immediate complications noted.  Discharge: Discharged home   Kevin Travis 08/18/2020   

## 2020-09-10 ENCOUNTER — Encounter (HOSPITAL_COMMUNITY): Payer: Self-pay | Admitting: Emergency Medicine

## 2020-09-10 ENCOUNTER — Emergency Department (HOSPITAL_COMMUNITY)
Admission: EM | Admit: 2020-09-10 | Discharge: 2020-09-10 | Disposition: A | Discharge status: Home / Self Care | Payer: PRIVATE HEALTH INSURANCE | Attending: Emergency Medicine | Admitting: Emergency Medicine

## 2020-09-10 ENCOUNTER — Emergency Department (HOSPITAL_COMMUNITY): Payer: PRIVATE HEALTH INSURANCE

## 2020-09-10 ENCOUNTER — Other Ambulatory Visit: Payer: Self-pay

## 2020-09-10 DIAGNOSIS — N401 Enlarged prostate with lower urinary tract symptoms: Secondary | ICD-10-CM | POA: Diagnosis not present

## 2020-09-10 DIAGNOSIS — N132 Hydronephrosis with renal and ureteral calculous obstruction: Secondary | ICD-10-CM | POA: Insufficient documentation

## 2020-09-10 DIAGNOSIS — R1031 Right lower quadrant pain: Secondary | ICD-10-CM | POA: Insufficient documentation

## 2020-09-10 DIAGNOSIS — I700 Atherosclerosis of aorta: Secondary | ICD-10-CM | POA: Diagnosis not present

## 2020-09-10 DIAGNOSIS — R1030 Lower abdominal pain, unspecified: Secondary | ICD-10-CM | POA: Diagnosis present

## 2020-09-10 DIAGNOSIS — I10 Essential (primary) hypertension: Secondary | ICD-10-CM | POA: Diagnosis not present

## 2020-09-10 DIAGNOSIS — N3001 Acute cystitis with hematuria: Secondary | ICD-10-CM

## 2020-09-10 DIAGNOSIS — Z79899 Other long term (current) drug therapy: Secondary | ICD-10-CM | POA: Insufficient documentation

## 2020-09-10 DIAGNOSIS — R112 Nausea with vomiting, unspecified: Secondary | ICD-10-CM | POA: Diagnosis not present

## 2020-09-10 DIAGNOSIS — R350 Frequency of micturition: Secondary | ICD-10-CM | POA: Insufficient documentation

## 2020-09-10 DIAGNOSIS — K8020 Calculus of gallbladder without cholecystitis without obstruction: Secondary | ICD-10-CM | POA: Diagnosis not present

## 2020-09-10 LAB — URINALYSIS, ROUTINE W REFLEX MICROSCOPIC
Bilirubin Urine: NEGATIVE
Glucose, UA: NEGATIVE mg/dL
Ketones, ur: NEGATIVE mg/dL
Nitrite: NEGATIVE
Protein, ur: NEGATIVE mg/dL
RBC / HPF: >50 RBC/hpf — ABNORMAL HIGH (ref 0–5)
Specific Gravity, Urine: 1.012 (ref 1.005–1.030)
pH: 6 (ref 5.0–8.0)

## 2020-09-10 LAB — CBC
HCT: 36.5 % — ABNORMAL LOW (ref 39.0–52.0)
Hemoglobin: 12.4 g/dL — ABNORMAL LOW (ref 13.0–17.0)
MCH: 29.8 pg (ref 26.0–34.0)
MCHC: 34 g/dL (ref 30.0–36.0)
MCV: 87.7 fL (ref 80.0–100.0)
Platelets: 130 10*3/uL — ABNORMAL LOW (ref 150–400)
RBC: 4.16 MIL/uL — ABNORMAL LOW (ref 4.22–5.81)
RDW: 13.2 % (ref 11.5–15.5)
WBC: 7.3 10*3/uL (ref 4.0–10.5)
nRBC: 0 % (ref 0.0–0.2)

## 2020-09-10 LAB — COMPREHENSIVE METABOLIC PANEL
ALT: 31 U/L (ref 0–44)
AST: 20 U/L (ref 15–41)
Albumin: 4.2 g/dL (ref 3.5–5.0)
Alkaline Phosphatase: 89 U/L (ref 38–126)
Anion gap: 11 (ref 5–15)
BUN: 24 mg/dL — ABNORMAL HIGH (ref 8–23)
CO2: 20 mmol/L — ABNORMAL LOW (ref 22–32)
Calcium: 9.9 mg/dL (ref 8.9–10.3)
Chloride: 108 mmol/L (ref 98–111)
Creatinine, Ser: 1.67 mg/dL — ABNORMAL HIGH (ref 0.61–1.24)
GFR, Estimated: 43 mL/min — ABNORMAL LOW (ref >60)
Glucose, Bld: 102 mg/dL — ABNORMAL HIGH (ref 70–99)
Potassium: 3.7 mmol/L (ref 3.5–5.1)
Sodium: 139 mmol/L (ref 135–145)
Total Bilirubin: 2.8 mg/dL — ABNORMAL HIGH (ref 0.3–1.2)
Total Protein: 7.1 g/dL (ref 6.5–8.1)

## 2020-09-10 LAB — LIPASE, BLOOD: Lipase: 25 U/L (ref 11–51)

## 2020-09-10 MED ORDER — SODIUM CHLORIDE 0.9 % IV SOLN
1.0000 g | Freq: Once | INTRAVENOUS | Status: AC
  Administered 2020-09-10: 1 g via INTRAVENOUS
  Filled 2020-09-10: qty 10

## 2020-09-10 MED ORDER — ONDANSETRON HCL 4 MG/2ML IJ SOLN
4.0000 mg | Freq: Once | INTRAMUSCULAR | Status: AC
  Administered 2020-09-10: 4 mg via INTRAVENOUS
  Filled 2020-09-10: qty 2

## 2020-09-10 MED ORDER — ONDANSETRON 4 MG PO TBDP
4.0000 mg | ORAL_TABLET | Freq: Once | ORAL | Status: AC | PRN
  Administered 2020-09-10: 4 mg via ORAL
  Filled 2020-09-10: qty 1

## 2020-09-10 MED ORDER — SODIUM CHLORIDE 0.9 % IV BOLUS
1000.0000 mL | Freq: Once | INTRAVENOUS | Status: AC
  Administered 2020-09-10: 1000 mL via INTRAVENOUS

## 2020-09-10 MED ORDER — CEPHALEXIN 500 MG PO CAPS: 500.0000 mg | ORAL_CAPSULE | Freq: Four times a day (QID) | ORAL | 0 refills | Status: AC

## 2020-09-10 NOTE — ED Triage Notes (Signed)
Pt arriving with complaint of abdominal pain and frequent urination. Pt report he had blood work drawn at providers office and he was told that his sodium is low. Pt also reports nausea for the last couple days. Received prescription for zofran from primary provider.

## 2020-09-10 NOTE — ED Provider Notes (Signed)
[facility]-EMERGENCY DEPT Provider Note   CSN: [id number] Arrival date & time: 09/10/20  0406     History Chief Complaint  Patient presents with   Abdominal Pain    Kevin Travis is a 64 y.o. male with PMH/o HTN who presents for evaluation of abdominal pain, nausea and increased urinary frequency.  He states this started about 3 days ago.  He states he has had some nausea and some occasional episodes of vomiting.   He states that he just felt "puny" all weekend.  He went to urgent care today and had blood work drawn.  He was called and told the sodium is 129 and his kidney function was up.  Patient states that he was not instructed to go to the ER by urgent care.  He states that throughout the night, he had increased urinary frequency.  He also noted that he was continue having some abdominal pain which is what prompted emergency department visit.  He does report that he has had some dysuria over the last few weeks but states he has not noted any hematuria.  He has not measured any fever.  He denies any chest pain, difficulty breathing.  The history is provided by the patient.       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 red wine at dinner   Drug use: No    Home Medications Prior to Admission medications   Medication Sig Start Date End Date Taking? Authorizing Provider  amLODipine (NORVASC) 10 MG tablet Take 10 mg by mouth daily.    [provider]  CALCIUM PO Take 1,000 mg by mouth daily.    [provider]  MAGNESIUM PO Take 1 tablet by mouth daily.    [provider]  saw palmetto 500 MG capsule Take 500 mg by mouth daily.    [provider]  Vitamin D, Cholecalciferol, 10 MCG (400 UNIT) CAPS Take 1 capsule by mouth daily.     [provider]  vitamin E 400 UNIT capsule Take 400 Units by mouth daily.    [provider]  zinc gluconate 50 MG tablet Take 50 mg by mouth daily.    [provider]    Allergies    Sulfa antibiotics  Review of Systems   Review of Systems  Constitutional: Negative for fever.  Respiratory: Negative for cough and shortness of breath.   Cardiovascular: Negative for chest pain.  Gastrointestinal: Positive for abdominal pain and nausea. Negative for vomiting.  Genitourinary: Positive for frequency. Negative for dysuria and hematuria.  Neurological: Negative for headaches.  All other systems reviewed and are negative.   Physical Exam Updated Vital Signs BP 114/84    Pulse (!) 56    Temp 98.1 F (36.7 C) (Oral)    Resp 14    Ht 5\' 8"  (1.727 m)    Wt 72.6 kg    SpO2 99%    BMI 24.33 kg/m   Physical Exam Vitals and nursing note reviewed.  Constitutional:      Appearance: Normal appearance. He is well-developed.  HENT:     Head: Normocephalic and atraumatic.  Eyes:     General: Lids are normal.     Conjunctiva/sclera: Conjunctivae normal.     Pupils: Pupils are equal, round, and reactive to light.  Cardiovascular:     Rate and Rhythm: Normal rate and regular rhythm.     Pulses: Normal pulses.     Heart sounds: Normal heart sounds. No murmur heard.  No friction rub. No  gallop.   Pulmonary:     Effort: Pulmonary effort is normal.     Breath sounds: Normal breath sounds.     Comments: Lungs clear to auscultation bilaterally.  Symmetric chest rise.  No wheezing, rales, rhonchi. Abdominal:     Palpations: Abdomen is soft. Abdomen is not rigid.     Tenderness: There is abdominal tenderness. There is no guarding.     Comments: Abdomen is soft, nondistended.  Diffuse tenderness noted to the lower abdomen no focal point noted.  No rigidity, guarding.  No CVA tenderness noted bilaterally.  Musculoskeletal:        General: Normal range of motion.     Cervical back: Full passive range of motion without pain.  Skin:    General: Skin is warm and dry.     Capillary Refill: Capillary refill takes less than 2 seconds.  Neurological:     Mental Status: He is alert and oriented to person, place, and time.  Psychiatric:        Speech: Speech normal.     ED Results / Procedures / Treatments   Labs (all labs ordered are listed, but only abnormal results are displayed) Labs Reviewed  LIPASE, BLOOD  COMPREHENSIVE METABOLIC PANEL  CBC  URINALYSIS, ROUTINE W REFLEX MICROSCOPIC    EKG None  Radiology No results found.  Procedures Procedures (including critical care time)  Medications Ordered in ED Medications  ondansetron (ZOFRAN-ODT) disintegrating tablet 4 mg (4 mg Oral Given 09/10/20 0548)  sodium chloride 0.9 % bolus 1,000 mL (1,000 mLs Intravenous New Bag/Given 09/10/20 0548)  ondansetron (ZOFRAN) injection 4 mg (4 mg Intravenous Given 09/10/20 0550)    ED Course  I have reviewed the triage vital signs and the nursing notes.  Pertinent labs & imaging results that were available during my care of the patient were reviewed by me and considered in my medical decision making (see chart for details).    MDM Rules/Calculators/A&P                          64 year old male who presents for evaluation of abdominal pain, nausea last few days.  Reports  some occasional episodes of vomiting.  Had blood drawn to urgent care today that showed low sodium as well as increased kidney function.  Throughout the night, he started having increased urinary frequency is always continue abdominal pain, prompting ED visit.  On initial arrival, he is afebrile nontoxic-appearing.  Vital signs are stable.  On exam,  he has diffuse lower abdominal tenderness.  No rigidity, guarding.  Plan to check labs.   Patient signed out to Berle Mull, PA-C pending labs and repreat evaluation of his abdomen.   Portions of this note were generated with Scientist, clinical (histocompatibility and immunogenetics). Dictation errors may occur despite best attempts at proofreading.  Final Clinical Impression(s) / ED Diagnoses Final diagnoses:  Lower abdominal pain    Rx / DC Orders ED Discharge Orders    None       Rosana Hoes 09/10/20 2549    Nira Conn, MD 09/10/20 1811

## 2020-09-10 NOTE — Discharge Instructions (Signed)
You have a UTI as well as kidney stones.  I have started you on antibiotics please take as prescribed.  I recommend taking over-the-counter pain medications like ibuprofen and or Tylenol every 6 as needed please follow dosing in the back of bottle.  It is important that you stay hydrated, please drink plenty of fluids as this will help with your urinary symptoms.  It is imperative that you follow-up with urology for further evaluation.  I given the contact information for an please contact them today.  Come back to the emergency department if you develop severe chest pain, shortness of breath, uncontrolled nausea, vomiting, diarrhea.

## 2020-09-10 NOTE — ED Provider Notes (Signed)
Patient received at handoff from [facility], she provided HPI, current work-up, likely disposition.  Please see her note for full detail.  In short patient with sent medical history of hypertension presents emerged department with chief complaint of abdominal pain that started on Sunday.  Patient states he has had general abdominal pain mainly on the right lower quadrant which sometimes radiates into his flank.  He endorses urinary frequency and urgency but denies dysuria, hematuria.  He also endorses that he had an episode of nausea and vomiting on Sunday night which has resolved since then.  He denies seeing any blood in his vomit, denies diarrhea or constipation, no blood in his stool.  He has no abdominal history.  Previous provider worked him up and labs reveal CBC without signs of leukocytosis, normocytic anemia appears to be at baseline for patient, CMP showing no Electra abnormalities, metabolic acidosis with CO2 20, hyperglycemia 102, bun 24, creatinine 1.67 which is above baseline for patient, T bili 2.8, no anion gap.  Lipase was 25, UA showing small leukocytes, many red blood cells, many bacteria.  Patient was given a liter of fluids, given for Zofran.  Physical Exam  BP 123/83   Pulse (!) 58   Temp 98.1 F (36.7 C) (Oral)   Resp 16   Ht 5\' 8"  (1.727 m)   Wt 72.6 kg   SpO2 99%   BMI 24.33 kg/m   Physical Exam Vitals and nursing note reviewed.  Constitutional:      General: He is not in acute distress.    Appearance: He is not ill-appearing.  HENT:     Head: Normocephalic and atraumatic.     Nose: No congestion.     Mouth/Throat:     Mouth: Mucous membranes are moist.     Pharynx: Oropharynx is clear.  Eyes:     General: No scleral icterus. Cardiovascular:     Rate and Rhythm: Normal rate and regular rhythm.     Pulses: Normal pulses.     Heart sounds: No murmur heard.  No friction rub. No gallop.   Pulmonary:     Effort: No respiratory distress.     Breath  sounds: No wheezing, rhonchi or rales.  Abdominal:     General: There is no distension.     Tenderness: There is no abdominal tenderness. There is no guarding.     Comments: Abdomen was visualized, nondistended, normoactive bowel sounds, dull to percussion, patient had slight tenderness to palpation along his right lower quadrant.  He had negative CVA tenderness, no acute abdomen noted on exam, no peritoneal sign.  Musculoskeletal:        General: No swelling.     Right lower leg: No edema.     Left lower leg: No edema.  Skin:    General: Skin is warm and dry.     Findings: No rash.  Neurological:     Mental Status: He is alert.  Psychiatric:        Mood and Affect: Mood normal.     ED Course/Procedures     Procedures  MDM  Due to patient's UA showing many RBCs and bacteria with complaints of right lower quadrant pain and frequent urination concern for possible UTI versus Pilo versus kidney stones.  Will send patient down for renal study for further evaluation.  CT renal showed right hydronephrosis and ureterctasis with periureter stranding.  No definite obstructing stone within the ureter, there is a  0.7 stone in the  dependent left urinary bladder which may reflect passive stone.  Multiple bilateral renal calculi, cholelithiasis without evidence of acute cholecystitis, enlarged prostate.  Will start patient on Rocephin and p.o. challenge patient if he is tolerating p.o. we will send him home on antibiotics.  Low suspicion for intra-abdominal abnormality requiring surgical intervention as he is tolerating p.o. without difficulty, no acute abdomen noted on exam, CT imaging does not show any acute findings.  Low suspicion for systemic infection as patient is nontoxic-appearing, vital signs reassuring, no obvious source of infection noted on exam. I have low suspicion for pyelonephritis or infected stone as patient denies flank pain, no CVA tenderness noted on exam, no leukocytosis noted  on CBC, tolerating p.o. without difficulty.  Low suspicion for urinary obstruction and he is urinating without difficulty.  I suspect patient had a kidney stone which spontaneously passed and subsequently developed a UTI.  Will discharge patient on oral antibiotics and have him follow-up with urology for further evaluation.  Patient vital signs remained stable, no indication for hospital admission.  Patient was given at home care as well strict return precautions.  Patient discussed with attending who agrees assessment plan.       Carroll Sage, PA-C 09/10/20 1052    Donnetta Hutching, MD 09/11/20 [phone]

## 2020-09-11 LAB — URINE CULTURE: Culture: NO GROWTH

## 2020-11-19 ENCOUNTER — Ambulatory Visit: Payer: PRIVATE HEALTH INSURANCE | Attending: Internal Medicine

## 2020-11-19 ENCOUNTER — Other Ambulatory Visit (HOSPITAL_BASED_OUTPATIENT_CLINIC_OR_DEPARTMENT_OTHER): Payer: Self-pay | Admitting: Internal Medicine

## 2020-11-19 DIAGNOSIS — Z23 Encounter for immunization: Secondary | ICD-10-CM

## 2020-11-19 NOTE — Progress Notes (Signed)
° °  Covid-19 Vaccination Clinic  Name:  Kevin Travis    MRN: [id number] DOB: [birth year]  11/19/2020  Kevin Travis was observed post Covid-19 immunization for 15 minutes without incident. He was provided with Vaccine Information Sheet and instruction to access the V-Safe system.   Kevin Travis was instructed to call 911 with any severe reactions post vaccine:  Difficulty breathing   Swelling of face and throat   A fast heartbeat   A bad rash all over body   Dizziness and weakness   Immunizations Administered    Name Date Dose VIS Date Route   Moderna Covid-19 Booster Vaccine 11/19/2020  9:16 AM 0.25 mL 09/18/2020 Intramuscular   Manufacturer: Gala Murdoch   Lot: [id number]   NDC: 85885-027-74

## 2020-11-21 MED FILL — MODERNA COVID-19 VACCINE 10: 100 | 1 days supply | Qty: 0 | Fill #0

## 2021-01-06 ENCOUNTER — Other Ambulatory Visit: Payer: Self-pay | Admitting: Family Medicine

## 2021-01-06 DIAGNOSIS — R7989 Other specified abnormal findings of blood chemistry: Secondary | ICD-10-CM

## 2021-01-16 ENCOUNTER — Other Ambulatory Visit: Payer: Self-pay | Admitting: Family Medicine

## 2021-01-20 ENCOUNTER — Ambulatory Visit
Admission: RE | Admit: 2021-01-20 | Discharge: 2021-01-20 | Disposition: A | Discharge status: Home / Self Care | Payer: PRIVATE HEALTH INSURANCE | Source: Ambulatory Visit | Attending: Family Medicine | Admitting: Family Medicine

## 2021-01-20 DIAGNOSIS — R7989 Other specified abnormal findings of blood chemistry: Secondary | ICD-10-CM

## 2021-02-10 ENCOUNTER — Other Ambulatory Visit: Payer: Self-pay

## 2021-02-10 DIAGNOSIS — Z125 Encounter for screening for malignant neoplasm of prostate: Secondary | ICD-10-CM

## 2021-02-11 ENCOUNTER — Other Ambulatory Visit: Payer: Self-pay

## 2021-02-11 ENCOUNTER — Other Ambulatory Visit: Payer: Self-pay | Admitting: *Deleted

## 2021-02-11 DIAGNOSIS — Z125 Encounter for screening for malignant neoplasm of prostate: Secondary | ICD-10-CM

## 2021-02-11 DIAGNOSIS — Z1211 Encounter for screening for malignant neoplasm of colon: Secondary | ICD-10-CM

## 2021-02-11 NOTE — Progress Notes (Signed)
Patient: Kevin Travis           Date of Birth: 30-Mar-1956           MRN: [id number] Visit Date: 02/11/2021 PCP: Darrow Bussing, MD  Prostate Cancer Screening Date of last physical exam: 04/21/20 Date of last rectal exam:  (never) Have you ever had any of the following?: Enlarged prostate Have you ever had or been told you have an allergy to latex products?: No Are you currently taking any natural prostate preparations?: No Are you currently experiencing any urinary symptoms?: No  Prostate Exam Exam not completed. PSA Only.  Patient's History Patient Active Problem List   Diagnosis Date Noted  . 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 Imbalance 10/17/2019  . Persistent headaches 10/17/2019  . 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 BPH associated with nocturia 08/17/2016  . Perineal pain in male 08/17/2016   Past Medical History:  Diagnosis Date  . Hypertension     Family History  Problem Relation Age of Onset  . Dementia Mother   . Heart disease Mother   . Kidney cancer Father   . Diabetes Father   . Dementia Father     Social History   Occupational History  . Not on file  Tobacco Use  . Smoking status: Never Smoker  . Smokeless tobacco: Never Used  Substance and Sexual Activity  . Alcohol use: Not Currently    Comment: 12 beers/year, 4 oz. red wine at dinner  . Drug use: No  . Sexual activity: Not Currently    Partners: Female

## 2021-02-12 ENCOUNTER — Telehealth: Payer: Self-pay

## 2021-02-12 LAB — PSA: Prostate Specific Ag, Serum: 2.3 ng/mL (ref 0.0–4.0)

## 2021-02-12 NOTE — Telephone Encounter (Signed)
Patient informed PSA results 2.3, normal. Patient verbalized understanding.

## 2021-02-19 ENCOUNTER — Telehealth: Payer: Self-pay

## 2021-02-19 LAB — FECAL OCCULT BLOOD, IMMUNOCHEMICAL: Fecal Occult Bld: NEGATIVE

## 2021-02-19 NOTE — Telephone Encounter (Signed)
Attempted to contact patient regarding lab results (FIT Test). Left message on voicemail requesting a return call. °

## 2021-03-19 ENCOUNTER — Other Ambulatory Visit (HOSPITAL_BASED_OUTPATIENT_CLINIC_OR_DEPARTMENT_OTHER): Payer: Self-pay

## 2021-03-20 ENCOUNTER — Ambulatory Visit: Payer: PRIVATE HEALTH INSURANCE

## 2021-03-21 ENCOUNTER — Ambulatory Visit: Payer: PRIVATE HEALTH INSURANCE | Attending: Internal Medicine

## 2021-03-21 ENCOUNTER — Other Ambulatory Visit (HOSPITAL_BASED_OUTPATIENT_CLINIC_OR_DEPARTMENT_OTHER): Payer: Self-pay

## 2021-03-21 ENCOUNTER — Other Ambulatory Visit: Payer: Self-pay

## 2021-03-21 DIAGNOSIS — Z23 Encounter for immunization: Secondary | ICD-10-CM

## 2021-03-21 MED ORDER — COVID-19 MRNA VACC (MODERNA) 100 MCG/0.5ML IM SUSP
INTRAMUSCULAR | 0 refills | Status: DC
  Filled 2021-03-21: qty 0.25, 1d supply, fill #0

## 2021-03-21 NOTE — Progress Notes (Signed)
   Covid-19 Vaccination Clinic  Name:  DAMAREE SARGENT    MRN: [id number] DOB: [birth year]  03/21/2021  Mr. Hoard was observed post Covid-19 immunization for 15 minutes without incident. He was provided with Vaccine Information Sheet and instruction to access the V-Safe system.   Mr. Tiberio was instructed to call 911 with any severe reactions post vaccine: Marland Kitchen Difficulty breathing  . Swelling of face and throat  . A fast heartbeat  . A bad rash all over body  . Dizziness and weakness   Immunizations Administered    Name Date Dose VIS Date Route   Moderna Covid-19 Booster Vaccine 03/21/2021  2:09 PM 0.25 mL 09/18/2020 Intramuscular   Manufacturer: Moderna   Lot: [id number]   NDC: 41030-131-43

## 2021-05-06 ENCOUNTER — Telehealth: Payer: Self-pay

## 2021-05-06 NOTE — Telephone Encounter (Signed)
Patient informed negative FIT Test results, and PSA level is 2.3, normal. Patient verbalized understanding.

## 2021-05-30 ENCOUNTER — Other Ambulatory Visit: Payer: Self-pay | Admitting: General Surgery

## 2022-03-01 ENCOUNTER — Other Ambulatory Visit: Payer: Self-pay | Admitting: Physician Assistant

## 2022-03-01 ENCOUNTER — Emergency Department (HOSPITAL_COMMUNITY): Payer: PRIVATE HEALTH INSURANCE

## 2022-03-01 ENCOUNTER — Encounter (HOSPITAL_COMMUNITY): Payer: Self-pay | Admitting: Emergency Medicine

## 2022-03-01 ENCOUNTER — Emergency Department (HOSPITAL_COMMUNITY)
Admission: EM | Admit: 2022-03-01 | Discharge: 2022-03-01 | Disposition: A | Discharge status: Home / Self Care | Payer: PRIVATE HEALTH INSURANCE | Attending: Emergency Medicine | Admitting: Emergency Medicine

## 2022-03-01 ENCOUNTER — Telehealth: Payer: Self-pay | Admitting: Physician Assistant

## 2022-03-01 ENCOUNTER — Other Ambulatory Visit: Payer: Self-pay

## 2022-03-01 DIAGNOSIS — R072 Precordial pain: Secondary | ICD-10-CM

## 2022-03-01 DIAGNOSIS — I209 Angina pectoris, unspecified: Secondary | ICD-10-CM | POA: Insufficient documentation

## 2022-03-01 DIAGNOSIS — I208 Other forms of angina pectoris: Secondary | ICD-10-CM

## 2022-03-01 DIAGNOSIS — I7121 Aneurysm of the ascending aorta, without rupture: Secondary | ICD-10-CM | POA: Diagnosis not present

## 2022-03-01 DIAGNOSIS — R0789 Other chest pain: Secondary | ICD-10-CM | POA: Diagnosis present

## 2022-03-01 LAB — TROPONIN I (HIGH SENSITIVITY)
Troponin I (High Sensitivity): 4 ng/L (ref <18)
Troponin I (High Sensitivity): 4 ng/L (ref <18)

## 2022-03-01 LAB — BASIC METABOLIC PANEL
Anion gap: 7 (ref 5–15)
BUN: 20 mg/dL (ref 8–23)
CO2: 29 mmol/L (ref 22–32)
Calcium: 10.5 mg/dL — ABNORMAL HIGH (ref 8.9–10.3)
Chloride: 104 mmol/L (ref 98–111)
Creatinine, Ser: 1.04 mg/dL (ref 0.61–1.24)
GFR, Estimated: >60 mL/min (ref >60)
Glucose, Bld: 124 mg/dL — ABNORMAL HIGH (ref 70–99)
Potassium: 4.1 mmol/L (ref 3.5–5.1)
Sodium: 140 mmol/L (ref 135–145)

## 2022-03-01 LAB — CBC
HCT: 47.8 % (ref 39.0–52.0)
Hemoglobin: 15.5 g/dL (ref 13.0–17.0)
MCH: 29 pg (ref 26.0–34.0)
MCHC: 32.4 g/dL (ref 30.0–36.0)
MCV: 89.3 fL (ref 80.0–100.0)
Platelets: 152 10*3/uL (ref 150–400)
RBC: 5.35 MIL/uL (ref 4.22–5.81)
RDW: 13 % (ref 11.5–15.5)
WBC: 7.9 10*3/uL (ref 4.0–10.5)
nRBC: 0 % (ref 0.0–0.2)

## 2022-03-01 MED ORDER — ALUM & MAG HYDROXIDE-SIMETH 200-200-20 MG/5ML PO SUSP
30.0000 mL | Freq: Once | ORAL | Status: AC
  Administered 2022-03-01: 30 mL via ORAL
  Filled 2022-03-01: qty 30

## 2022-03-01 MED ORDER — IOHEXOL 350 MG/ML SOLN
100.0000 mL | Freq: Once | INTRAVENOUS | Status: AC | PRN
  Administered 2022-03-01: 100 mL via INTRAVENOUS

## 2022-03-01 MED ORDER — NITROGLYCERIN 0.4 MG SL SUBL: 0.4000 mg | SUBLINGUAL_TABLET | SUBLINGUAL | 0 refills | Status: DC | PRN

## 2022-03-01 NOTE — ED Triage Notes (Signed)
Pt reports around 3am he noticed a substernal "pressure" at this time he also had sweats/chills and nausea and "a little light-headed."  Pt reports his blood pressure was elevated.  Pt reports his pressure have been varying "a lot."    ?

## 2022-03-01 NOTE — Discharge Instructions (Addendum)
You are seen in the ER for chest pain. ? ?Our work-up shows is effectively ruled out a myocardial infarction for a heart attack earlier today.  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 ? ?You have opted for outpatient management or inpatient management.  Cardiology service will call you to set up an appointment tomorrow, if they do not call you by 1 PM, call the number provided to ensure that you get a close follow-up. ? ?Please return to the ER if you have worsening chest pain, shortness of breath, pain radiating to your jaw, shoulder, or back, sweats or fainting.  ?

## 2022-03-01 NOTE — Telephone Encounter (Addendum)
? ?  Dr. Izora Ribas evaluated patient in ER today. He recommended patient be discharged from ER with plan for outpatient exercise nuclear stress test on the D-SPECT scanner. Will route to triage and scheduling to assist with setting this stress test up as well as a follow-up appointment with Dr. Izora Ribas or APP thereafter.  ? ?Triage, patient will need to be called with stress test instructions as he is going home from the ER under their discharge care. Dr. Izora Ribas discussed procedure with patient and I will attach attestation for cosign here along with order. Patient is list at 194lb. We told the ER to let the patient know office would call him to arrange these appointments. Thank you! ? ?Sejla Marzano PA-C ? ?

## 2022-03-01 NOTE — ED Provider Notes (Addendum)
?[facility] EMERGENCY DEPARTMENT ?[facility] ? ? ?CSN: [id number] ?Arrival date & time: 03/01/22  0425 ? ?  ? ?History ? ?Chief Complaint  ?Patient presents with  ? Chest Pain  ? ? ?Kevin Travis is a 66 y.o. male. ? ?HPI ? ?HPI: A 66 year old patient with a history of hypertension presents for evaluation of chest pain. Initial onset of pain was approximately 1-3 hours ago. The patient's chest pain is described as heaviness/pressure/tightness and is not worse with exertion. The patient complains of nausea and reports some diaphoresis. The patient's chest pain is middle- or left-sided, is not well-localized, is not sharp and does not radiate to the arms/jaw/neck. The patient has a family history of coronary artery disease in a first-degree relative with onset less than age 355. The patient has no history of stroke, has no history of peripheral artery disease, has not smoked in the past 90 days, denies any history of treated diabetes, has no history of hypercholesterolemia and does not have an elevated BMI (>=30).  ? ?Patient indicates that he woke up in the middle the night with chest pain.  Chest pain is located in the lower part of his chest and epigastric region.  No history of similar pain.  He has history of GERD, but this pain is not burning in nature.  He had episode of nausea, diaphoresis and shortness of breath.  His symptoms have now resolved. ? ?He denies any cardiac work-up recently. ?No substance use disorder. ? ?Home Medications ?Prior to Admission medications   ?Medication Sig Start Date End Date Taking? Authorizing Provider  ?amLODipine (NORVASC) 5 MG tablet Take 5 mg by mouth daily.   Yes [provider]  ?CALCIUM PO Take 1,000 mg by mouth daily.   Yes [provider]  ?Calcium-Magnesium-Zinc [phone] MG TABS Take 1 capsule by mouth daily.   Yes [provider]  ?esomeprazole (NEXIUM) 20 MG capsule Take 20 mg by mouth daily. 12/12/21  Yes [provider]  ?gabapentin (NEURONTIN) 100 MG capsule Take 200 mg by mouth 2 (two) times daily. 02/11/22  Yes [provider]  ?Multiple Vitamin (MULTIVITAMIN WITH MINERALS) TABS tablet Take 1 tablet by mouth daily.   Yes [provider]  ?COVID-19 mRNA vaccine, Moderna, 100 MCG/0.5ML injection Inject into the muscle. 03/21/21   Judyann MunsonSnider, Cynthia, MD  ?   ? ?Allergies    ?Sulfa antibiotics   ? ?Review of Systems   ?Review of Systems  ?All other systems reviewed and are negative. ? ?Physical Exam ?Updated Vital Signs ?BP (!) 139/93 (BP Location: Right Arm)   Pulse 80   Temp 97.9 ?F (36.6 ?C) (Oral)   Resp 18   Ht 5\' 11"  (1.803 m)   Wt 88 kg   SpO2 98%   BMI 27.06 kg/m?  ?Physical Exam ?Vitals and nursing note reviewed.  ?Constitutional:   ?   Appearance: He is well-developed.  ?HENT:  ?   Head: Atraumatic.  ?Cardiovascular:  ?   Rate and Rhythm: Normal rate.  ?   Pulses:     ?     Radial pulses are 2+ on the right side and 2+ on the left side.  ?Pulmonary:  ?   Effort: Pulmonary effort is normal.  ?Musculoskeletal:  ?   Cervical back: Neck supple.  ?   Right lower leg: No tenderness. No edema.  ?   Left lower leg: No tenderness. No edema.  ?Skin: ?   General: Skin is  warm.  ?Neurological:  ?   Mental Status: He is alert and oriented to person, place, and time.  ? ? ?ED Results / Procedures / Treatments   ?Labs ?(all labs ordered are listed, but only abnormal results are displayed) ?Labs Reviewed  ?BASIC METABOLIC PANEL - Abnormal; Notable for the following components:  ?    Result Value  ? Glucose, Bld 124 (*)   ? Calcium 10.5 (*)   ? All other components within normal limits  ?CBC  ?TROPONIN I (HIGH SENSITIVITY)  ?TROPONIN I (HIGH SENSITIVITY)  ? ? ?EKG ?EKG Interpretation ? ?Date/Time:  Sunday March 01 2022 04:41:03 EDT ?Ventricular Rate:  62 ?PR Interval:  174 ?QRS Duration: 108 ?QT Interval:  400 ?QTC Calculation: 406 ?R Axis:   -41 ?Text Interpretation: Normal sinus rhythm Left axis  deviation Incomplete right bundle branch block Abnormal ECG When compared with ECG of 15-Nov-2018 14:04, PREVIOUS ECG IS PRESENT No significant change since last tracing Confirmed by Derwood Kaplan [id number]) on 03/01/2022 7:43:05 AM ? ?Radiology ?DG Chest 2 View ? ?Result Date: 03/01/2022 ?CLINICAL DATA:  Chest pain. EXAM: CHEST - 2 VIEW COMPARISON:  11/15/2018 FINDINGS: The lungs are clear without focal pneumonia, edema, pneumothorax or pleural effusion. The cardiopericardial silhouette is within normal limits for size. Prominent right mediastinal border is stable consistent with benign etiology although this appearance can be seen in the setting of ascending thoracic aortic aneurysm. IMPRESSION: 1. Stable exam.  No acute cardiopulmonary findings. 2. Prominence of the right mediastinal border is unchanged but can be seen in the setting of ascending thoracic aortic aneurysm. CT chest without contrast could be used to further evaluate as clinically warranted. Electronically Signed   By: Kennith Center M.D.   On: 03/01/2022 06:13  ? ?CT CHEST WO CONTRAST ? ?Result Date: 03/01/2022 ?CLINICAL DATA:  Chest pain.  Abnormal chest x-ray. EXAM: CT CHEST WITHOUT CONTRAST TECHNIQUE: Multidetector CT imaging of the chest was performed following the standard protocol without IV contrast. RADIATION DOSE REDUCTION: This exam was performed according to the departmental dose-optimization program which includes automated exposure control, adjustment of the mA and/or kV according to patient size and/or use of iterative reconstruction technique. COMPARISON:  Chest x-ray March 01, 2022. CT scan of the chest May 12, 2022. FINDINGS: Cardiovascular: The ascending thoracic aorta measures 4.2 cm in maximum AP dimension. The ascending thoracic aorta measured up to 3.9 cm in AP dimension on the May 12, 2010 study. No calcified atherosclerosis is identified in the thoracic aorta. The central pulmonary arteries are normal in appearance. Calcified  atherosclerotic changes are identified in the left anterior descending coronary artery and the circumflex coronary artery. The heart size is normal. Mediastinum/Nodes: No enlarged mediastinal or axillary lymph nodes. Thyroid gland, trachea, and esophagus demonstrate no significant findings. Lungs/Pleura: Lungs are clear. No pleural effusion or pneumothorax. Upper Abdomen: No acute abnormality. Musculoskeletal: No chest wall mass or suspicious bone lesions identified. IMPRESSION: 1. The ascending thoracic aorta measures 4.2 cm in maximum AP dimension on today's study versus 3.9 cm on the May 12, 2010 study. The lack of contrast precludes evaluation for dissection. Recommend annual imaging followup by CTA or MRA. This recommendation follows 2010 ACCF/AHA/AATS/ACR/ASA/SCA/SCAI/SIR/STS/SVM Guidelines for the Diagnosis and Management of Patients with Thoracic Aortic Disease. Circulation. 2010; 121: W413-K440. Aortic aneurysm NOS (ICD10-I71.9) 2. Calcified atherosclerotic changes in the left anterior descending and circumflex coronary arteries. 3. No other abnormalities. Electronically Signed   By: Gerome Sam III M.D.   On: 03/01/2022 10:58  ? ?  CT Angio Chest/Abd/Pel for Dissection W and/or W/WO ? ?Result Date: 03/01/2022 ?CLINICAL DATA:  Chest pain, aortic syndrome suspected EXAM: CT ANGIOGRAPHY CHEST, ABDOMEN AND PELVIS TECHNIQUE: Non-contrast CT of the chest was obtained earlier on the same day. Multidetector CT imaging through the chest, abdomen and pelvis was performed using the standard protocol during bolus administration of intravenous contrast. Multiplanar reconstructed images and MIPs were obtained and reviewed to evaluate the vascular anatomy. RADIATION DOSE REDUCTION: This exam was performed according to the departmental dose-optimization program which includes automated exposure control, adjustment of the mA and/or kV according to patient size and/or use of iterative reconstruction technique. CONTRAST:   OMNIPAQUE IOHEXOL 350 MG/ML SOLN COMPARISON:  03/01/2022 CT chest, correlation is also made with 08/29/2021 CT urogram FINDINGS: CTA CHEST FINDINGS Cardiovascular: Preferential opacification of the thoracic aorta. No e

## 2022-03-01 NOTE — ED Notes (Signed)
Pt ambulated to the restroom with no issues 

## 2022-03-01 NOTE — ED Notes (Signed)
All discharge instructions including follow up care and prescriptions reviewed with patient and patient verbalized understanding of same. Patient stable and ambulatory at time of discharge.  

## 2022-03-01 NOTE — Progress Notes (Signed)
? ?Cardiology consult note:  ? ?Patient ID: Kevin Travis ?MRN: [id number]; DOB: October 28, 1956  ? ?Admission date: 03/01/2022 ? ?PCP:  Darrow BussingKoirala, Dibas, MD ?  ?[facility] Providers ?Cardiologist:  None      ? ?Chief Complaint:  Chest pain ? ?Patient Profile:  ? ?Kevin Travis [place] is a 66 y.o. male with HTN who is being seen 03/01/2022 for the evaluation of chest pain. ? ?History of Present Illness:  ? ?Mr. [name] is an active gentleman with no prior coronary artery disease. ? ?He notes he is an active person, able to trail run with no issues save for R knee pain.  He had a normal day 02/28/22: ate dinner of pork chops yesterday fixed a car battery other wise normal.  He had been weaning his amlodipine from 10 mg to 5 mg in the setting of LE edema with normal BP save for occasional elevated  D BP 90s-100s at highest. ? ?This early AM CP syndrome sudden onset chest pain.  Associated with nausea and vomiting.  Radiated into his back. ?Went to ED-> he has one brother who has an ICD, and that brothers twin has prior PCI. ? ?He received Mylanta.  Has not had symptoms.   ?EKG showed SR and iRBBB. ?Troponin was normal. ?A ? 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 ?EKG SR iRBBB ?No sx here since [place] ?Given question on his CXR he had a Non Conc CT with concern for TAA, his Contrasted study showed TAA 4.2 cm without evidence of dissection. ? ?At time of eval, no CP, back pain, SOB, DOE. No palpitations. ?Had questions regarding his TAA. ? ? ?Past Medical History:  ?Diagnosis Date  ? Hypertension   ? ? ?Past Surgical History:  ?Procedure Laterality Date  ? BACK SURGERY    ? LEG SURGERY    ? VASCULAR SURGERY    ?  ? ?Medications Prior to Admission: ?Prior to Admission medications   ?Medication Sig Start Date End Date Taking? Authorizing Provider  ?amLODipine (NORVASC) 5 MG tablet Take 5 mg by mouth daily.   Yes [provider]  ?CALCIUM PO Take 1,000 mg by mouth daily.    Yes [provider]  ?Calcium-Magnesium-Zinc [phone] MG TABS Take 1 capsule by mouth daily.   Yes [provider]  ?esomeprazole (NEXIUM) 20 MG capsule Take 20 mg by mouth daily. 12/12/21  Yes [provider]  ?gabapentin (NEURONTIN) 100 MG capsule Take 200 mg by mouth 2 (two) times daily. 02/11/22  Yes [provider]  ?Multiple Vitamin (MULTIVITAMIN WITH MINERALS) TABS tablet Take 1 tablet by mouth daily.   Yes [provider]  ?nitroGLYCERIN (NITROSTAT) 0.4 MG SL tablet Place 1 tablet (0.4 mg total) under the tongue every 5 (five) minutes as needed for chest pain. 03/01/22  Yes Derwood KaplanNanavati, Ankit, MD  ?COVID-19 mRNA vaccine, Moderna, 100 MCG/0.5ML injection Inject into the muscle. 03/21/21   Judyann MunsonSnider, Cynthia, MD  ?  ? ?Allergies:    ?Allergies  ?Allergen Reactions  ? Sulfa Antibiotics Rash  ? ? ?Social History:   ?Social History  ? ?Socioeconomic History  ? Marital status: Divorced  ?  Spouse name: Not on file  ? Number of children: Not on file  ? Years of education: Not on file  ? Highest education level: Not on file  ?Occupational History  ? Not on file  ?Tobacco Use  ? Smoking status: Never  ?  Smokeless tobacco: Never  ?Substance and Sexual Activity  ? Alcohol use: Not Currently  ?  Comment: 12 beers/year, 4 oz. red wine at dinner  ? Drug use: No  ? Sexual activity: Not Currently  ?  Partners: Female  ?Other Topics Concern  ? Not on file  ?Social History Narrative  ? Right handed  ? Lives in single story home alone  ? ?Social Determinants of Health  ? ?Financial Resource Strain: Not on file  ?Food Insecurity: Not on file  ?Transportation Needs: Not on file  ?Physical Activity: Not on file  ?Stress: Not on file  ?Social Connections: Not on file  ?Intimate Partner Violence: Not on file  ?  ?Family History:   ?The patient's family history includes Dementia in his father and mother; Diabetes in his father; Heart disease in his mother; Kidney cancer in his father.   ? ?ROS:   ?Please see the history of present illness.  ?All other ROS reviewed and negative.    ? ?Physical Exam/Data:  ? ?Vitals:  ? 03/01/22 1400 03/01/22 1430 03/01/22 1445 03/01/22 1557  ?BP: 120/79 140/85 (!) 139/93 113/80  ?Pulse: 65 65 80 (!) 58  ?Resp: 12 16 18 14   ?Temp:    98.1 ?F (36.7 ?C)  ?TempSrc:    Oral  ?SpO2: 100% 95% 98% 97%  ?Weight:      ?Height:      ? ?No intake or output data in the 24 hours ending 03/01/22 1846 ? ?  03/01/2022  ?  8:45 AM 09/10/2020  ?  6:20 AM 11/10/2019  ?  7:47 AM  ?Last 3 Weights  ?Weight (lbs) 194 lb 160 lb 190 lb  ?Weight (kg) 87.998 kg 72.576 kg 86.183 kg  ?   ?Body mass index is 27.06 kg/m?.  ?General:  Well nourished, well developed, in no acute distress ?HEENT: normal ?Neck: no JVD ?Vascular: No carotid bruits; Distal pulses 2+ bilaterally   ?Cardiac:  normal S1, S2; Slight crescendo systolic murmur ?Lungs:  clear to auscultation bilaterally, no wheezing, rhonchi or rales  ?Abd: soft, nontender, no hepatomegaly  ?Ext: trace bilateral edema ?Musculoskeletal:  No deformities, BUE and BLE strength normal and equal ?Skin: warm and dry  ?Neuro:  CNs 2-12 intact, no focal abnormalities noted ?Psych:  Normal affect  ? ?Laboratory Data: ? ?High Sensitivity Troponin:   ?Recent Labs  ?Lab 03/01/22 ?05/01/22 03/01/22 ?05/01/22  ?TROPONINIHS 4 4  ?    ?Chemistry ?Recent Labs  ?Lab 03/01/22 ?05/01/22  ?NA 140  ?K 4.1  ?CL 104  ?CO2 29  ?GLUCOSE 124*  ?BUN 20  ?CREATININE 1.04  ?CALCIUM 10.5*  ?GFRNONAA >60  ?ANIONGAP 7  ?  ?No results for input(s): PROT, ALBUMIN, AST, ALT, ALKPHOS, BILITOT in the last 168 hours. ?Lipids No results for input(s): CHOL, TRIG, HDL, LABVLDL, LDLCALC, CHOLHDL in the last 168 hours. ?Hematology ?Recent Labs  ?Lab 03/01/22 ?05/01/22  ?WBC 7.9  ?RBC 5.35  ?HGB 15.5  ?HCT 47.8  ?MCV 89.3  ?MCH 29.0  ?MCHC 32.4  ?RDW 13.0  ?PLT 152  ? ?Thyroid No results for input(s): TSH, FREET4 in the last 168 hours. ?BNPNo results for input(s): BNP, PROBNP in the last 168 hours.  ?DDimer No  results for input(s): DDIMER in the last 168 hours. ? ? ?Radiology/Studies:  ?DG Chest 2 View ? ?Result Date: 03/01/2022 ?CLINICAL DATA:  Chest pain. EXAM: CHEST - 2 VIEW COMPARISON:  11/15/2018 FINDINGS: The lungs are clear without focal pneumonia, edema, pneumothorax or  pleural effusion. The cardiopericardial silhouette is within normal limits for size. Prominent right mediastinal border is stable consistent with benign etiology although this appearance can be seen in the setting of ascending thoracic aortic aneurysm. IMPRESSION: 1. Stable exam.  No acute cardiopulmonary findings. 2. Prominence of the right mediastinal border is unchanged but can be seen in the setting of ascending thoracic aortic aneurysm. CT chest without contrast could be used to further evaluate as clinically warranted. Electronically Signed   By: Kennith Center M.D.   On: 03/01/2022 06:13  ? ?CT CHEST WO CONTRAST ? ?Result Date: 03/01/2022 ?CLINICAL DATA:  Chest pain.  Abnormal chest x-ray. EXAM: CT CHEST WITHOUT CONTRAST TECHNIQUE: Multidetector CT imaging of the chest was performed following the standard protocol without IV contrast. RADIATION DOSE REDUCTION: This exam was performed according to the departmental dose-optimization program which includes automated exposure control, adjustment of the mA and/or kV according to patient size and/or use of iterative reconstruction technique. COMPARISON:  Chest x-ray March 01, 2022. CT scan of the chest May 12, 2022. FINDINGS: Cardiovascular: The ascending thoracic aorta measures 4.2 cm in maximum AP dimension. The ascending thoracic aorta measured up to 3.9 cm in AP dimension on the May 12, 2010 study. No calcified atherosclerosis is identified in the thoracic aorta. The central pulmonary arteries are normal in appearance. Calcified atherosclerotic changes are identified in the left anterior descending coronary artery and the circumflex coronary artery. The heart size is normal. Mediastinum/Nodes: No  enlarged mediastinal or axillary lymph nodes. Thyroid gland, trachea, and esophagus demonstrate no significant findings. Lungs/Pleura: Lungs are clear. No pleural effusion or pneumothorax. Upper Abdomen:

## 2022-03-02 ENCOUNTER — Telehealth (HOSPITAL_COMMUNITY): Payer: Self-pay | Admitting: Radiology

## 2022-03-02 NOTE — Telephone Encounter (Signed)
Patient given detailed instructions per Myocardial Perfusion Study Information Sheet for the test on 03/03/2022 at 7:30. Patient notified to arrive 15 minutes early and that it is imperative to arrive on time for appointment to keep from having the test rescheduled. ? If you need to cancel or reschedule your appointment, please call the office within 24 hours of your appointment. . Patient verbalized understanding.EHK ? ?

## 2022-03-02 NOTE — Telephone Encounter (Signed)
Called pt reviewed stress test instructions.  Pt wrote down address and instructions for testing.  Pt had no questions or concerns.   ?

## 2022-03-03 ENCOUNTER — Ambulatory Visit (HOSPITAL_COMMUNITY): Payer: PRIVATE HEALTH INSURANCE | Attending: Cardiovascular Disease

## 2022-03-03 DIAGNOSIS — R072 Precordial pain: Secondary | ICD-10-CM | POA: Diagnosis not present

## 2022-03-03 LAB — MYOCARDIAL PERFUSION IMAGING
Angina Index: 0
Duke Treadmill Score: 11
Estimated workload: 12.9
Exercise duration (min): 10 min
Exercise duration (sec): 43 s
LV dias vol: 84 mL (ref 62–150)
LV sys vol: 32 mL
MPHR: 157 {beats}/min
Nuc Stress EF: 62 %
Peak HR: 151 {beats}/min
Percent HR: 96 %
Rest HR: 77 {beats}/min
Rest Nuclear Isotope Dose: 10.1 mCi
SDS: 1
SRS: 0
SSS: 1
ST Depression (mm): 0 mm
Stress Nuclear Isotope Dose: 30.4 mCi
TID: 0.83

## 2022-03-03 MED ORDER — REGADENOSON 0.4 MG/5ML IV SOLN: 0.4000 mg | Freq: Once | INTRAVENOUS | Status: AC

## 2022-03-03 MED ORDER — TECHNETIUM TC 99M TETROFOSMIN IV KIT
30.4000 | PACK | Freq: Once | INTRAVENOUS | Status: AC | PRN
  Administered 2022-03-03: 30.4 via INTRAVENOUS
  Filled 2022-03-03: qty 31

## 2022-03-03 MED ORDER — TECHNETIUM TC 99M TETROFOSMIN IV KIT
10.1000 | PACK | Freq: Once | INTRAVENOUS | Status: AC | PRN
  Administered 2022-03-03: 10.1 via INTRAVENOUS
  Filled 2022-03-03: qty 11

## 2022-04-12 NOTE — Progress Notes (Signed)
?Cardiology Office Note:   ? ?Date:  04/13/2022  ? ?ID:  Kevin Travis, DOB 1956-03-18, MRN [id number] ? ?PCP:  Lujean Amel, MD ?  ?[facility] Providers ?Cardiologist:  Werner Lean, MD    ? ?Referring MD: Lujean Amel, MD  ? ?Chief Complaint: follow-up chest pain ? ?History of Present Illness:   ? ?Kevin Travis is a pleasant 66 y.o. male with a hx of hypertension, BPH, and Barrett's esophagus.  ? ?He was seen in the ED by Dr. Gasper Sells on 03/01/22 for evaluation of chest pain. He reported sudden onset chest pain associated with n/v, pain radiated to his back. Went to ED, EKG revealed SR with ICRBBB, normal troponin. Had non-contrast CT for concern for TAA, which was 4.2 cm without dissection. His symptoms resolved with Mylanta. Was sent for outpatient SPECT stress which revealed normal resting and stress perfusion, no ischemia or infarction EF 65%, normal ETT response to exercise with ICRBBB and rare PVCs with exercise.  ? ?Today, he is here alone for post-hospital follow-up. He reports pain in lower sternum that radiates up to esophagus, similar to what he experienced on 4/2. He feels it is coming from his esophagus. Only Pepto Bismol relieves his symptoms. Has not taken nitroglycerin. Has EGD scheduled for Friday.  He has been seen on 2 additional occasions since ED visit 4/2.  At [facility] walk-in clinic, EKG looked the same and there was no concern for ACS.  He has tried to continue his regular walking 1.25 miles daily but the pain in his esophagus gets worse. He denies SOB. Has bilateral LE swelling, has varicose veins. Does not wear compression.  Thinks amlodipine is contributing to leg swelling. He denies fatigue, palpitations, melena, hematuria, hemoptysis, diaphoresis, weakness, presyncope, syncope, orthopnea, and PND. ? ?Past Medical History:  ?Diagnosis Date  ? Hypertension   ? ? ?Past Surgical History:  ?Procedure Laterality Date  ? BACK SURGERY    ? LEG SURGERY    ? VASCULAR  SURGERY    ? ? ?Current Medications: ?Current Meds  ?Medication Sig  ? amLODipine (NORVASC) 5 MG tablet Take 5 mg by mouth daily.  ? CALCIUM PO Take 1,000 mg by mouth daily.  ? Calcium-Magnesium-Zinc 333-133-5 MG TABS Take 1 capsule by mouth daily.  ? COVID-19 mRNA vaccine, Moderna, 100 MCG/0.5ML injection Inject into the muscle.  ? esomeprazole (NEXIUM) 40 MG capsule Take 40 mg by mouth daily.  ? gabapentin (NEURONTIN) 100 MG capsule Take 200 mg by mouth 2 (two) times daily.  ? Multiple Vitamin (MULTIVITAMIN WITH MINERALS) TABS tablet Take 1 tablet by mouth daily.  ? nitroGLYCERIN (NITROSTAT) 0.4 MG SL tablet Place 1 tablet (0.4 mg total) under the tongue every 5 (five) minutes as needed for chest pain.  ? pantoprazole (PROTONIX) 40 MG tablet Take 40 mg by mouth daily.  ?  ? ?Allergies:   Sulfa antibiotics  ? ?Social History  ? ?Socioeconomic History  ? Marital status: Divorced  ?  Spouse name: Not on file  ? Number of children: Not on file  ? Years of education: Not on file  ? Highest education level: Not on file  ?Occupational History  ? Not on file  ?Tobacco Use  ? Smoking status: Never  ? Smokeless tobacco: Never  ?Substance and Sexual Activity  ? Alcohol use: Not Currently  ?  Comment: 12 beers/year, 4 oz. red wine at dinner  ? Drug use: No  ? Sexual activity: Not Currently  ?  Partners:  Female  ?Other Topics Concern  ? Not on file  ?Social History Narrative  ? Right handed  ? Lives in single story home alone  ? ?Social Determinants of Health  ? ?Financial Resource Strain: Not on file  ?Food Insecurity: Not on file  ?Transportation Needs: Not on file  ?Physical Activity: Not on file  ?Stress: Not on file  ?Social Connections: Not on file  ?  ? ?Family History: ?The patient's family history includes Dementia in his father and mother; Diabetes in his father; Heart disease in his mother; Kidney cancer in his father. ? ?ROS:   ?Please see the history of present illness.    ?+ chest pain ?All other systems reviewed  and are negative. ? ?Labs/Other Studies Reviewed:   ? ?The following studies were reviewed today: ? ?Gated SPECT Exercise 03/03/22 ? ?  The study is normal. The study is low risk. ?  No ST deviation was noted. ?  LV perfusion is normal. There is no evidence of ischemia. There is no evidence of infarction. ?  Left ventricular function is normal. Nuclear stress EF: 62 %. The left ventricular ejection fraction is normal (55-65%). End diastolic cavity size is normal. End systolic cavity size is normal. ?  Prior study not available for comparison. ?  ?Normal resting and stress perfusion. No ischemia or infarction EF ?65%  Normal ETT response to exercise with ICRBBB and rare PVC;s ?With exercise  ? ?CTA Chest/Abd/Pelvis 03/01/22 ? ?IMPRESSION: ?1. The ascending thoracic aorta measures up to 4.2 cm in maximum AP ?dimension, previously 3.9 cm on 05/12/2010. No evidence of ?dissection or other dilatation of the thoracic or abdominal aorta. ?Recommend annual imaging followup by CTA or MRA. This recommendation ?follows 2010 ACCF/AHA/AATS/ACR/ASA/SCA/SCAI/SIR/STS/SVM Guidelines ?for the Diagnosis and Management of Patients with Thoracic Aortic ?Disease. Circulation. 2010; [id number]. Aortic aneurysm NOS ?(ICD10-I71.9) ?2. Prostatomegaly, which has increased slightly since 08/29/2021. ?3. No acute abnormality in the abdomen or pelvis. ? ?Recent Labs: ?03/01/2022: BUN 20; Creatinine, Ser 1.04; Hemoglobin 15.5; Platelets 152; Potassium 4.1; Sodium 140  ?Recent Lipid Panel ?No results found for: CHOL, TRIG, HDL, CHOLHDL, VLDL, LDLCALC, LDLDIRECT ? ? ?Risk Assessment/Calculations:   ?  ? ? ?Physical Exam:   ? ?VS:  BP (!) 152/80   Pulse (!) 56   Ht 5\' 11"  (1.803 m)   Wt 200 lb 12.8 oz (91.1 kg)   SpO2 99%   BMI 28.01 kg/m?    ? ?Wt Readings from Last 3 Encounters:  ?04/13/22 200 lb 12.8 oz (91.1 kg)  ?03/03/22 194 lb (88 kg)  ?03/01/22 194 lb (88 kg)  ?  ? ?GEN:  Well nourished, well developed in no acute distress ?HEENT:  Normal ?NECK: No JVD; No carotid bruits ?CARDIAC: RRR, no murmurs, rubs, gallops ?RESPIRATORY:  Clear to auscultation without rales, wheezing or rhonchi  ?ABDOMEN: Soft, non-tender, non-distended ?MUSCULOSKELETAL:  No edema; No deformity. 2+ pedal pulses, equal bilaterally ?SKIN: Warm and dry ?NEUROLOGIC:  Alert and oriented x 3 ?PSYCHIATRIC:  Normal affect  ? ?EKG:  EKG is not ordered today.   ? ?Diagnoses:   ? ?1. Essential hypertension   ?2. Precordial pain   ?3. Right bundle branch block   ?4. Ascending aorta dilatation (HCC)   ? ?Assessment and Plan:   ? ? ?Chest pain: Continue to have pain that radiates from the lower sternum/top of stomach up to esophagus.  Exercise Myoview was low risk, no evidence of ischemia or infarction. Symptoms relieved by Pepto-Bismol. Scheduled for  EGD in 4 days. No indication for further ischemia evaluation at this time.  ? ?Hypertension: BP initially elevated. BP by my recheck is 128/84.  We discussed stopping amlodipine in the setting of leg swelling.  He would prefer not to change due to concerns over medication impairing his kidney function.  ? ?Ascending aortic dilatation: 42 mm by CT 02/2022, no evidence of dissection. We will plan to repeat imaging (echo, CT, or MR) in 1 year for surveillance. Continue good BP control.  ? ?Right bundle branch block: Incomplete RBBB seen on EKG in ED 03/01/2022.  We did not repeat EKG today.  We will plan to repeat EKG at next office visit. ? ?Disposition: 3 months with Dr. Gasper Sells ? ? ?Medication Adjustments/Labs and Tests Ordered: ?Current medicines are reviewed at length with the patient today.  Concerns regarding medicines are outlined above.  ?No orders of the defined types were placed in this encounter. ? ?No orders of the defined types were placed in this encounter. ? ? ?Patient Instructions  ?Medication Instructions:  ? ?Your physician recommends that you continue on your current medications as directed. Please refer to the Current  Medication list given to you today. ? ? ?*If you need a refill on your cardiac medications before your next appointment, please call your pharmacy* ? ? ?Lab Work: ? ?None ordered.  ? ?If you have labs (blood work) dr

## 2022-04-13 ENCOUNTER — Ambulatory Visit (INDEPENDENT_AMBULATORY_CARE_PROVIDER_SITE_OTHER): Payer: PRIVATE HEALTH INSURANCE | Admitting: Nurse Practitioner

## 2022-04-13 ENCOUNTER — Encounter: Payer: Self-pay | Admitting: Nurse Practitioner

## 2022-04-13 VITALS — BP 152/80 | HR 56 | Ht 71.0 in | Wt 200.8 lb

## 2022-04-13 DIAGNOSIS — I10 Essential (primary) hypertension: Secondary | ICD-10-CM | POA: Diagnosis not present

## 2022-04-13 DIAGNOSIS — R072 Precordial pain: Secondary | ICD-10-CM | POA: Diagnosis not present

## 2022-04-13 DIAGNOSIS — I4510 Unspecified right bundle-branch block: Secondary | ICD-10-CM | POA: Diagnosis not present

## 2022-04-13 DIAGNOSIS — I77810 Thoracic aortic ectasia: Secondary | ICD-10-CM | POA: Diagnosis not present

## 2022-04-13 NOTE — Patient Instructions (Signed)
Medication Instructions:   Your physician recommends that you continue on your current medications as directed. Please refer to the Current Medication list given to you today.   *If you need a refill on your cardiac medications before your next appointment, please call your pharmacy*   Lab Work:  None ordered.  If you have labs (blood work) drawn today and your tests are completely normal, you will receive your results only by: MyChart Message (if you have MyChart) OR A paper copy in the mail If you have any lab test that is abnormal or we need to change your treatment, we will call you to review the results.   Testing/Procedures:  None ordered.   Follow-Up: At [facility], you and your health needs are our priority.  As part of our continuing mission to provide you with exceptional heart care, we have created designated Provider Care Teams.  These Care Teams include your primary Cardiologist (physician) and Advanced Practice Providers (APPs -  Physician Assistants and Nurse Practitioners) who all work together to provide you with the care you need, when you need it.  We recommend signing up for the patient portal called "MyChart".  Sign up information is provided on this After Visit Summary.  MyChart is used to connect with patients for Virtual Visits (Telemedicine).  Patients are able to view lab/test results, encounter notes, upcoming appointments, etc.  Non-urgent messages can be sent to your provider as well.   To learn more about what you can do with MyChart, go to [email or web address].    Your next appointment:   3 month(s)  The format for your next appointment:   In Person  Provider:   Mahesh A Chandrasekhar, MD     Important Information About Sugar       

## 2022-07-25 NOTE — Progress Notes (Unsigned)
Cardiology Office Note:    Date:  07/27/2022   ID:  Kevin Travis, DOB 10-Nov-1956, MRN [id number]  PCP:  Darrow Bussing, MD   [facility] Providers Cardiologist:  Christell Constant, MD     Referring MD: Darrow Bussing, MD   CC: Chest pain f/u  History of Present Illness:    Kevin Travis is a 66 y.o. male with a hx of HTN, PVCs,  and Barrett's Esophagus. 2023: found to have Mild Aortic Dilation; negative stress test. Saw NP- still having reflux.  Patient notes that he is doing well.   Swims thirty lengths of the pool with no issues. There are no interval hospital/ED visit.    No chest pain or pressure.  No SOB/DOE and no PND/Orthopnea.  No weight gain or leg swelling.  No palpitations or syncope .   Past Medical History:  Diagnosis Date   Hypertension     Past Surgical History:  Procedure Laterality Date   BACK SURGERY     LEG SURGERY     VASCULAR SURGERY      Current Medications: Current Meds  Medication Sig   amLODipine (NORVASC) 5 MG tablet Take 5 mg by mouth daily.   Cholecalciferol (VITAMIN D3) 25 MCG (1000 UT) CAPS Take by mouth daily.   esomeprazole (NEXIUM) 20 MG capsule Take 20 mg by mouth daily at 12 noon.   Multiple Vitamin (MULTIVITAMIN WITH MINERALS) TABS tablet Take 1 tablet by mouth daily.   Probiotic Product ([employer] PROBIOTIC PO) Take 500 m by mouth daily.     Allergies:   Atenolol and Sulfa antibiotics   Social History   Socioeconomic History   Marital status: Divorced    Spouse name: Not on file   Number of children: Not on file   Years of education: Not on file   Highest education level: Not on file  Occupational History   Not on file  Tobacco Use   Smoking status: Never   Smokeless tobacco: Never  Substance and Sexual Activity   Alcohol use: Not Currently    Comment: 12 beers/year, 4 oz. red wine at dinner   Drug use: No   Sexual activity: Not Currently    Partners: Female  Other Topics Concern   Not on file   Social History Narrative   Right handed   Lives in single story home alone   Social Determinants of Health   Financial Resource Strain: Not on file  Food Insecurity: Not on file  Transportation Needs: Not on file  Physical Activity: Not on file  Stress: Not on file  Social Connections: Not on file     Family History: The patient's family history includes Dementia in his father and mother; Diabetes in his father; Heart disease in his mother; Kidney cancer in his father.  ROS:   Please see the history of present illness.     All other systems reviewed and are negative.  EKGs/Labs/Other Studies Reviewed:    The following studies were reviewed today:   GATED SPECT MYO PERF W/EXERCISE STRESS 1D 03/03/2022  Narrative   The study is normal. The study is low risk.   No ST deviation was noted.   LV perfusion is normal. There is no evidence of ischemia. There is no evidence of infarction.   Left ventricular function is normal. Nuclear stress EF: 62 %. The left ventricular ejection fraction is normal (55-65%). End diastolic cavity size is normal. End systolic cavity size is normal.  Prior study not available for comparison.  Normal resting and stress perfusion. No ischemia or infarction EF 65%  Normal ETT response to exercise with ICRBBB and rare PVC;s With exercise      Recent Labs: 03/01/2022: BUN 20; Creatinine, Ser 1.04; Hemoglobin 15.5; Platelets 152; Potassium 4.1; Sodium 140  Recent Lipid Panel No results found for: "CHOL", "TRIG", "HDL", "CHOLHDL", "VLDL", "LDLCALC", "LDLDIRECT"      Physical Exam:    VS:  BP 126/80   Pulse 60   Ht 5\' 11"  (1.803 m)   Wt 202 lb (91.6 kg)   SpO2 98%   BMI 28.17 kg/m     Wt Readings from Last 3 Encounters:  07/27/22 202 lb (91.6 kg)  04/13/22 200 lb 12.8 oz (91.1 kg)  03/03/22 194 lb (88 kg)    Gen: no distress   Neck: No JVD Ears: R ear Frank Sign Cardiac: No Rubs or Gallops, No murmur, regular rhythm, +2 radial  pulses Respiratory: Clear to auscultation bilaterally, normal effort, normal  respiratory rate GI: Soft, nontender, non-distended  MS: Non  edema;  moves all extremities Integument: Skin feels warm Neuro:  At time of evaluation, alert and oriented to person/place/time/situation  Psych: Normal affect, patient feels well   ASSESSMENT:    1. Aortic dilatation (HCC)   2. Aortic atherosclerosis (HCC)   3. PVC (premature ventricular contraction)   4. Essential hypertension    PLAN:    Mild Aortic Dilation - Echo in April 2023, if measurement < 45 mm will not repeat CT in 2024 for this indications  Aortic atherosclerosis - LDL above goal (95) - he eats very health and swims 30 labs a day, there are little extra we have available for lifestyle changes - when we repeat the CT scan (2024 04 2025) if he has increased vascular calcium we will again talk about medical therapy (offered pravastatin 10 and zetia 10 as he has a history of cryptogenic transaminitis)  PACs/PVCs - rare asymptomatic no therapy  HTN - on norvasc 5 with minimal leg swelling  April 2024 me or APP      Medication Adjustments/Labs and Tests Ordered: Current medicines are reviewed at length with the patient today.  Concerns regarding medicines are outlined above.  No orders of the defined types were placed in this encounter.  No orders of the defined types were placed in this encounter.   There are no Patient Instructions on file for this visit.   Signed, May 2024, MD  07/27/2022 8:44 AM    [facility]

## 2022-07-27 ENCOUNTER — Encounter: Payer: Self-pay | Admitting: Internal Medicine

## 2022-07-27 ENCOUNTER — Other Ambulatory Visit (HOSPITAL_COMMUNITY): Payer: Self-pay

## 2022-07-27 ENCOUNTER — Ambulatory Visit: Payer: PRIVATE HEALTH INSURANCE | Attending: Internal Medicine | Admitting: Internal Medicine

## 2022-07-27 VITALS — BP 126/80 | HR 60 | Ht 71.0 in | Wt 202.0 lb

## 2022-07-27 DIAGNOSIS — I10 Essential (primary) hypertension: Secondary | ICD-10-CM | POA: Insufficient documentation

## 2022-07-27 DIAGNOSIS — I493 Ventricular premature depolarization: Secondary | ICD-10-CM | POA: Diagnosis not present

## 2022-07-27 DIAGNOSIS — I77819 Aortic ectasia, unspecified site: Secondary | ICD-10-CM | POA: Diagnosis not present

## 2022-07-27 DIAGNOSIS — I700 Atherosclerosis of aorta: Secondary | ICD-10-CM | POA: Diagnosis not present

## 2022-07-27 NOTE — Patient Instructions (Addendum)
Medication Instructions:  Your physician recommends that you continue on your current medications as directed. Please refer to the Current Medication list given to you today.  *If you need a refill on your cardiac medications before your next appointment, please call your pharmacy*  Testing/Procedures: ECHO April of 2021 Your physician has requested that you have an echocardiogram. Echocardiography is a painless test that uses sound waves to create images of your heart. It provides your doctor with information about the size and shape of your heart and how well your heart's chambers and valves are working. This procedure takes approximately one hour. There are no restrictions for this procedure.  Follow-Up: At [facility], you and your health needs are our priority.  As part of our continuing mission to provide you with exceptional heart care, we have created designated Provider Care Teams.  These Care Teams include your primary Cardiologist (physician) and Advanced Practice Providers (APPs -  Physician Assistants and Nurse Practitioners) who all work together to provide you with the care you need, when you need it.   Your next appointment:   8 month(s) April following his ECHO  The format for your next appointment:   In Person  Provider:   Christell Constant, MD  {I

## 2022-10-19 ENCOUNTER — Telehealth: Payer: Self-pay | Admitting: Internal Medicine

## 2022-10-19 NOTE — Telephone Encounter (Signed)
Patient was calling bout echo that he needs to schd for April but there is no order. Please advise

## 2022-10-19 NOTE — Telephone Encounter (Signed)
Pt echo scheduled for 03/02/23.

## 2023-01-05 ENCOUNTER — Ambulatory Visit (INDEPENDENT_AMBULATORY_CARE_PROVIDER_SITE_OTHER): Payer: PRIVATE HEALTH INSURANCE

## 2023-01-05 ENCOUNTER — Ambulatory Visit (INDEPENDENT_AMBULATORY_CARE_PROVIDER_SITE_OTHER): Payer: PRIVATE HEALTH INSURANCE | Admitting: Podiatry

## 2023-01-05 DIAGNOSIS — M7742 Metatarsalgia, left foot: Secondary | ICD-10-CM | POA: Diagnosis not present

## 2023-01-05 DIAGNOSIS — M7741 Metatarsalgia, right foot: Secondary | ICD-10-CM

## 2023-01-05 NOTE — Progress Notes (Signed)
  Subjective:  Patient ID: Kevin Travis, male    DOB: 1955/12/11,  MRN: [id number]  Chief Complaint  Patient presents with   Foot Pain    Bilateral foot pain in the ball of the foot  Pt stated that he has been having pain since October     66 y.o. male presents with the above complaint.  Patient presents with bilateral forefoot pain.  Patient is doing well for quite some time since October.  It is very dull achy in nature hurts when he is walking for a while.  He is using some cushions in the bottom of his foot.  Denies any other acute complaints of generalized pains across the ball of both of his feet.  He is not diabetic.  He would like to discuss treatment options.   Review of Systems: Negative except as noted in the HPI. Denies N/V/F/Ch.  Past Medical History:  Diagnosis Date   Hypertension     Current Outpatient Medications:    amLODipine (NORVASC) 5 MG tablet, Take 5 mg by mouth daily., Disp: , Rfl:    Cholecalciferol (VITAMIN D3) 25 MCG (1000 UT) CAPS, Take by mouth daily., Disp: , Rfl:    esomeprazole (NEXIUM) 20 MG capsule, Take 20 mg by mouth daily at 12 noon., Disp: , Rfl:    Multiple Vitamin (MULTIVITAMIN WITH MINERALS) TABS tablet, Take 1 tablet by mouth daily., Disp: , Rfl:    Probiotic Product ([employer] PROBIOTIC PO), Take 500 m by mouth daily., Disp: , Rfl:  No current facility-administered medications for this visit.  Facility-Administered Medications Ordered in Other Visits:    regadenoson (LEXISCAN) injection SOLN 0.4 mg, 0.4 mg, Intravenous, Once, Josue Hector, MD  Social History   Tobacco Use  Smoking Status Never  Smokeless Tobacco Never    Allergies  Allergen Reactions   Atenolol     Other reaction(s): low heart rate, Other   Sulfa Antibiotics Rash   Objective:  There were no vitals filed for this visit. There is no height or weight on file to calculate BMI. Constitutional Well developed. Well nourished.  Vascular Dorsalis pedis pulses palpable  bilaterally. Posterior tibial pulses palpable bilaterally. Capillary refill normal to all digits.  No cyanosis or clubbing noted. Pedal hair growth normal.  Neurologic Normal speech. Oriented to person, place, and time. Epicritic sensation to light touch grossly present bilaterally.  Dermatologic Nails well groomed and normal in appearance. No open wounds. No skin lesions.  Orthopedic: Bilateral forefoot metatarsalgia pain noted.  No pain with individual metatarsophalangeal joints.  Negative Mulder's click/neuroma signs.  Plantar fat pad atrophy noted.   Radiographs: 3 views of schedule measure of the lateral left foot: Midfoot arthritis noted.  No bony abnormalities noted no fractures noted. Assessment:   1. Metatarsalgia, right foot   2. Metatarsalgia of left foot    Plan:  Patient was evaluated and treated and all questions answered.  Bilateral forefoot metatarsalgia with underlying plantar fat pad atrophy -All questions and concerns were discussed with the patient in extensive detail -Given the amount of pain that she he is experiencing he will benefit from metatarsal pads incorporation into his power steps orthotics.  If this is helping he will think about orthotics option as well. -If there is any improvement with back in 4 weeks and we will discuss orthotics option  No follow-ups on file.

## 2023-01-11 ENCOUNTER — Telehealth: Payer: Self-pay | Admitting: Internal Medicine

## 2023-01-11 NOTE — Telephone Encounter (Signed)
Called pt advised that per MD note the medications he would offer are per 07/27/22 OV:    "- LDL above goal (95) - he eats very health and swims 30 labs a day, there are little extra we have available for lifestyle changes - when we repeat the CT scan (2024 04 2025) if he has increased vascular calcium we will again talk about medical therapy (offered pravastatin 10 and zetia 10 as he has a history of cryptogenic transaminitis)"   Advised pt of meds and dosage.  Pt wants to let MD know he is swimming 3 times weekly for a total of 750 yards.

## 2023-01-11 NOTE — Telephone Encounter (Signed)
Patient states Dr. Gasper Sells was considering placing him on a cholesterol medication, but they were going to wait 6 months to determine whether or not the medication will be needed. Patient is requesting the name of dose of the medication for Medicare. Patient mentions that he will be in a zoom meeting from 1:00-2:00 PM. If his call is not returned prior to that time frame, he requests a call back around 3:00 PM if possible.

## 2023-02-02 ENCOUNTER — Ambulatory Visit (INDEPENDENT_AMBULATORY_CARE_PROVIDER_SITE_OTHER): Payer: PRIVATE HEALTH INSURANCE | Admitting: Podiatry

## 2023-02-02 DIAGNOSIS — M7741 Metatarsalgia, right foot: Secondary | ICD-10-CM | POA: Diagnosis not present

## 2023-02-02 NOTE — Progress Notes (Signed)
  Subjective:  Patient ID: Kevin Travis, male    DOB: [birth year],  MRN: [id number]  Chief Complaint  Patient presents with   Foot Pain    Pt stated that he is doing okay no new concerns at this time     67 y.o. male presents with the above complaint.  Patient presents for follow-up of bilateral metatarsalgia.  He states is doing a lot better.  Shoe gear modification helped considerably.  Denies any other acute complaints.   Review of Systems: Negative except as noted in the HPI. Denies N/V/F/Ch.  Past Medical History:  Diagnosis Date   Hypertension     Current Outpatient Medications:    amLODipine (NORVASC) 5 MG tablet, Take 5 mg by mouth daily., Disp: , Rfl:    Cholecalciferol (VITAMIN D3) 25 MCG (1000 UT) CAPS, Take by mouth daily., Disp: , Rfl:    esomeprazole (NEXIUM) 20 MG capsule, Take 20 mg by mouth daily at 12 noon., Disp: , Rfl:    Multiple Vitamin (MULTIVITAMIN WITH MINERALS) TABS tablet, Take 1 tablet by mouth daily., Disp: , Rfl:    Probiotic Product ([employer] PROBIOTIC PO), Take 500 m by mouth daily., Disp: , Rfl:  No current facility-administered medications for this visit.  Facility-Administered Medications Ordered in Other Visits:    regadenoson (LEXISCAN) injection SOLN 0.4 mg, 0.4 mg, Intravenous, Once, Josue Hector, MD  Social History   Tobacco Use  Smoking Status Never  Smokeless Tobacco Never    Allergies  Allergen Reactions   Atenolol     Other reaction(s): low heart rate, Other   Sulfa Antibiotics Rash   Objective:  There were no vitals filed for this visit. There is no height or weight on file to calculate BMI. Constitutional Well developed. Well nourished.  Vascular Dorsalis pedis pulses palpable bilaterally. Posterior tibial pulses palpable bilaterally. Capillary refill normal to all digits.  No cyanosis or clubbing noted. Pedal hair growth normal.  Neurologic Normal speech. Oriented to person, place, and time. Epicritic sensation to  light touch grossly present bilaterally.  Dermatologic Nails well groomed and normal in appearance. No open wounds. No skin lesions.  Orthopedic: No further bilateral forefoot metatarsalgia pain noted.  No pain with individual metatarsophalangeal joints.  Negative Mulder's click/neuroma signs.  Plantar fat pad atrophy noted.   Radiographs: 3 views of schedule measure of the lateral left foot: Midfoot arthritis noted.  No bony abnormalities noted no fractures noted. Assessment:   No diagnosis found.  Plan:  Patient was evaluated and treated and all questions answered.  Bilateral forefoot metatarsalgia with underlying plantar fat pad atrophy -All questions and concerns were discussed with the patient in extensive detail -Clinically healed is doing much better.  Shoe gear modification helped considerably.  He has been incorporating power steps into his regular shoes.  At this time if any foot and ankle issues on future he will come back and see me.  No follow-ups on file.

## 2023-03-02 ENCOUNTER — Ambulatory Visit (HOSPITAL_COMMUNITY): Payer: PRIVATE HEALTH INSURANCE | Attending: Cardiology

## 2023-03-02 DIAGNOSIS — I358 Other nonrheumatic aortic valve disorders: Secondary | ICD-10-CM | POA: Insufficient documentation

## 2023-03-02 DIAGNOSIS — I493 Ventricular premature depolarization: Secondary | ICD-10-CM | POA: Insufficient documentation

## 2023-03-02 DIAGNOSIS — R072 Precordial pain: Secondary | ICD-10-CM

## 2023-03-02 DIAGNOSIS — I77819 Aortic ectasia, unspecified site: Secondary | ICD-10-CM | POA: Insufficient documentation

## 2023-03-02 DIAGNOSIS — I10 Essential (primary) hypertension: Secondary | ICD-10-CM | POA: Insufficient documentation

## 2023-03-02 LAB — ECHOCARDIOGRAM COMPLETE
Area-P 1/2: 4.89 cm2
S' Lateral: 2.9 cm

## 2023-03-08 ENCOUNTER — Ambulatory Visit: Payer: PRIVATE HEALTH INSURANCE | Attending: Internal Medicine | Admitting: Internal Medicine

## 2023-03-08 ENCOUNTER — Encounter: Payer: Self-pay | Admitting: Internal Medicine

## 2023-03-08 VITALS — BP 131/75 | HR 62 | Ht 71.0 in | Wt 205.6 lb

## 2023-03-08 DIAGNOSIS — I493 Ventricular premature depolarization: Secondary | ICD-10-CM

## 2023-03-08 DIAGNOSIS — I77819 Aortic ectasia, unspecified site: Secondary | ICD-10-CM | POA: Diagnosis not present

## 2023-03-08 DIAGNOSIS — I10 Essential (primary) hypertension: Secondary | ICD-10-CM

## 2023-03-08 DIAGNOSIS — I700 Atherosclerosis of aorta: Secondary | ICD-10-CM | POA: Diagnosis not present

## 2023-03-08 NOTE — Progress Notes (Signed)
Cardiology Office Note:    Date:  03/08/2023   ID:  Kevin Travis, DOB 1956/04/11, MRN [id number]  PCP:  Darrow Bussing, MD    [facility] Providers Cardiologist:  Christell Constant, MD     Referring MD: Darrow Bussing, MD   CC: Mild aortic dilation   History of Present Illness:    Kevin Travis is a 67 y.o. male with a hx of HTN, PVCs,  and Barrett's Esophagus. 2023: found to have Mild Aortic Dilation; negative stress test. Saw NP- still having reflux.  Swimming 30 pool lengths 2024: Mild aortic dilation.  Patient notes that he is doing well.   Since last visit notes that he is still swimming . There are no interval hospital/ED visit.    No chest pain or pressure .  No SOB/DOE and no PND/Orthopnea.  No weight gain or leg swelling.  No palpitations or syncope.  No PVCs or PACs on echo today.   Past Medical History:  Diagnosis Date   Hypertension     Past Surgical History:  Procedure Laterality Date   BACK SURGERY     LEG SURGERY     VASCULAR SURGERY      Current Medications: Current Meds  Medication Sig   amLODipine (NORVASC) 5 MG tablet Take 5 mg by mouth daily.   Cholecalciferol (VITAMIN D3) 25 MCG (1000 UT) CAPS Take by mouth daily.   esomeprazole (NEXIUM) 20 MG capsule Take 20 mg by mouth daily at 12 noon.   Multiple Vitamin (MULTIVITAMIN WITH MINERALS) TABS tablet Take 1 tablet by mouth daily.   Probiotic Product ([employer] PROBIOTIC PO) Take 500 m by mouth daily.     Allergies:   Atenolol and Sulfa antibiotics   Social History   Socioeconomic History   Marital status: Divorced    Spouse name: Not on file   Number of children: Not on file   Years of education: Not on file   Highest education level: Not on file  Occupational History   Not on file  Tobacco Use   Smoking status: Never   Smokeless tobacco: Never  Substance and Sexual Activity   Alcohol use: Not Currently    Comment: 12 beers/year, 4 oz. red wine at dinner   Drug use: No    Sexual activity: Not Currently    Partners: Female  Other Topics Concern   Not on file  Social History Narrative   Right handed   Lives in single story home alone   Social Determinants of Health   Financial Resource Strain: Not on file  Food Insecurity: Not on file  Transportation Needs: Not on file  Physical Activity: Not on file  Stress: Not on file  Social Connections: Not on file     Family History: The patient's family history includes Dementia in his father and mother; Diabetes in his father; Heart disease in his mother; Kidney cancer in his father.  ROS:   Please see the history of present illness.     All other systems reviewed and are negative.  EKGs/Labs/Other Studies Reviewed:    The following studies were reviewed today:  EKG: 03/08/23: SR LVH  Cardiac Studies & Procedures     STRESS TESTS  MYOCARDIAL PERFUSION IMAGING 03/03/2022  Narrative   The study is normal. The study is low risk.   No ST deviation was noted.   LV perfusion is normal. There is no evidence of ischemia. There is no evidence of infarction.   Left ventricular  function is normal. Nuclear stress EF: 62 %. The left ventricular ejection fraction is normal (55-65%). End diastolic cavity size is normal. End systolic cavity size is normal.   Prior study not available for comparison.  Normal resting and stress perfusion. No ischemia or infarction EF 65%  Normal ETT response to exercise with ICRBBB and rare PVC;s With exercise   ECHOCARDIOGRAM  ECHOCARDIOGRAM COMPLETE 03/02/2023  Narrative ECHOCARDIOGRAM REPORT    Patient Name:   Kevin Travis Date of Exam: 03/02/2023 Medical Rec #:  [id number]        Height:       71.0 in Accession #:    [id number]       Weight:       202.0 lb Date of Birth:  29-Jan-1956        BSA:          2.117 m Patient Age:    66 years         BP:           151/99 mmHg Patient Gender: M                HR:           63 bpm. Exam Location:  [address]  Procedure: 2D Echo, 3D Echo, Cardiac Doppler, Color Doppler and Strain Analysis  Indications:    I77.89 Dilated aorta  History:        Patient has no prior history of Echocardiogram examinations. Arrythmias:PVC; Risk Factors:Hypertension. Nuc med study done 03/2023 no ischemia LVEF 65%.  Sonographer:    Chanetta MarshallEdwina Douglas BA, RDCS Referring Phys: [facility] A Tyreshia Ingman  IMPRESSIONS   1. Left ventricular ejection fraction, by estimation, is 70 to 75%. Left ventricular ejection fraction by 3D volume is 74 %. The left ventricle has hyperdynamic function. The left ventricle has no regional wall motion abnormalities. Left ventricular diastolic parameters are consistent with Grade I diastolic dysfunction (impaired relaxation). The average left ventricular global longitudinal strain is -19.8 %. The global longitudinal strain is normal. 2. Right ventricular systolic function is normal. The right ventricular size is normal. 3. The mitral valve is normal in structure. Trivial mitral valve regurgitation. No evidence of mitral stenosis. 4. The aortic valve is tricuspid. Aortic valve regurgitation is trivial. Aortic valve sclerosis/calcification is present, without any evidence of aortic stenosis. 5. The inferior vena cava is normal in size with greater than 50% respiratory variability, suggesting right atrial pressure of 3 mmHg. 6. Aortic root diameter 3.9cm and ascending aortia 4.1cm are normal for indexed values for BSA and sex. 7. Right atrial size was mildly dilated.  FINDINGS Left Ventricle: Left ventricular ejection fraction, by estimation, is 70 to 75%. Left ventricular ejection fraction by 3D volume is 74 %. The left ventricle has hyperdynamic function. The left ventricle has no regional wall motion abnormalities. The average left ventricular global longitudinal strain is -19.8 %. The global longitudinal strain is normal. The left ventricular internal cavity size was normal in size. There  is no left ventricular hypertrophy. Left ventricular diastolic parameters are consistent with Grade I diastolic dysfunction (impaired relaxation). Normal left ventricular filling pressure.  Right Ventricle: The right ventricular size is normal. No increase in right ventricular wall thickness. Right ventricular systolic function is normal.  Left Atrium: Left atrial size was normal in size.  Right Atrium: Right atrial size was mildly dilated.  Pericardium: There is no evidence of pericardial effusion.  Mitral Valve: The mitral valve is normal in structure. Trivial  mitral valve regurgitation. No evidence of mitral valve stenosis.  Tricuspid Valve: The tricuspid valve is normal in structure. Tricuspid valve regurgitation is trivial. No evidence of tricuspid stenosis.  Aortic Valve: The aortic valve is tricuspid. Aortic valve regurgitation is trivial. Aortic valve sclerosis/calcification is present, without any evidence of aortic stenosis.  Pulmonic Valve: The pulmonic valve was normal in structure. Pulmonic valve regurgitation is trivial. No evidence of pulmonic stenosis.  Aorta: The aortic root is normal in size and structure.  Venous: The inferior vena cava is normal in size with greater than 50% respiratory variability, suggesting right atrial pressure of 3 mmHg.  IAS/Shunts: No atrial level shunt detected by color flow Doppler.   LEFT VENTRICLE PLAX 2D LVIDd:         4.70 cm         Diastology LVIDs:         2.90 cm         LV e' medial:    7.18 cm/s LV PW:         0.70 cm         LV E/e' medial:  10.7 LV IVS:        1.10 cm         LV e' lateral:   11.10 cm/s LVOT diam:     1.90 cm         LV E/e' lateral: 6.9 LV SV:         65 LV SV Index:   31              2D LVOT Area:     2.84 cm        Longitudinal Strain 2D Strain GLS  -21.1 % (A2C): 2D Strain GLS  -19.7 % (A3C): 2D Strain GLS  -18.5 % (A4C): 2D Strain GLS  -19.8 % Avg:  3D Volume EF LV 3D EF:     Left ventricul ar ejection fraction by 3D volume is 74 %.  3D Volume EF: 3D EF:        74 % LV EDV:       124 ml LV ESV:       32 ml LV SV:        91 ml  RIGHT VENTRICLE RV Basal diam:  3.80 cm RV Mid diam:    3.10 cm RVSP:           24.7 mmHg  LEFT ATRIUM             Index        RIGHT ATRIUM           Index LA diam:        2.70 cm 1.28 cm/m   RA Pressure: 3.00 mmHg LA Vol (A2C):   48.9 ml 23.09 ml/m  RA Area:     22.00 cm LA Vol (A4C):   34.3 ml 16.20 ml/m  RA Volume:   69.10 ml  32.63 ml/m LA Biplane Vol: 42.0 ml 19.83 ml/m AORTIC VALVE LVOT Vmax:   112.00 cm/s LVOT Vmean:  69.700 cm/s LVOT VTI:    0.229 m  AORTA Ao Root diam: 3.90 cm Ao Asc diam:  4.10 cm  MITRAL VALVE                TRICUSPID VALVE MV Area (PHT): 4.89 cm     TR Peak grad:   21.7 mmHg MV Decel Time: 155 msec     TR Vmax:  233.00 cm/s MV E velocity: 76.50 cm/s   Estimated RAP:  3.00 mmHg MV A velocity: 113.00 cm/s  RVSP:           24.7 mmHg MV E/A ratio:  0.68 SHUNTS Systemic VTI:  0.23 m Systemic Diam: 1.90 cm  Armanda Magic MD Electronically signed by Armanda Magic MD Signature Date/Time: 03/02/2023/10:58:26 AM    Final              Recent Labs: No results found for requested labs within last 365 days.  Recent Lipid Panel No results found for: "CHOL", "TRIG", "HDL", "CHOLHDL", "VLDL", "LDLCALC", "LDLDIRECT"      Physical Exam:    VS:  BP 131/75   Pulse 62   Ht 5\' 11"  (1.803 m)   Wt 205 lb 9.6 oz (93.3 kg)   SpO2 97%   BMI 28.68 kg/m     Wt Readings from Last 3 Encounters:  03/08/23 205 lb 9.6 oz (93.3 kg)  07/27/22 202 lb (91.6 kg)  04/13/22 200 lb 12.8 oz (91.1 kg)    Gen: No distress   Neck: No JVD Ears: Bilateral Homero Fellers Sign Cardiac: No Rubs or Gallops, Soft diastolic murmur, regular rhythm, +2 radial pulses Respiratory: Clear to auscultation bilaterally, normal effort, normal  respiratory rate GI: Soft, nontender, non-distended  MS: Non  edema;  moves  all extremities Integument: Skin feels warm Neuro:  At time of evaluation, alert and oriented to person/place/time/situation  Psych: Normal affect, patient feels well   ASSESSMENT:    1. Aortic dilatation   2. Aortic atherosclerosis   3. PVC (premature ventricular contraction)   4. Essential hypertension     PLAN:    Mild Aortic Dilation Suspect trivial AI from exam - planned for yearly echo eval due to normal size when indexed to BSA, next in 2025  Aortic atherosclerosis - LDL direct today - he eats very health and swims 30 labs a day, there are little interventions we have available for lifestyle changes - may start low dose statin  HTN - on norvasc 5 with minimal leg swelling  PACs/PVCs - rare asymptomatic no therapy   One year me and/or team      Medication Adjustments/Labs and Tests Ordered: Current medicines are reviewed at length with the patient today.  Concerns regarding medicines are outlined above.  Orders Placed This Encounter  Procedures   LDL cholesterol, direct   EKG 12-Lead   ECHOCARDIOGRAM COMPLETE   No orders of the defined types were placed in this encounter.   Patient Instructions  Medication Instructions:  Your physician recommends that you continue on your current medications as directed. Please refer to the Current Medication list given to you today.  *If you need a refill on your cardiac medications before your next appointment, please Travis your pharmacy*   Lab Work: TODAY: LDL Direct  If you have labs (blood work) drawn today and your tests are completely normal, you will receive your results only by: MyChart Message (if you have MyChart) OR A paper copy in the mail If you have any lab test that is abnormal or we need to change your treatment, we will Travis you to review the results.   Testing/Procedures: APRIL 2025: Your physician has requested that you have an echocardiogram. Echocardiography is a painless test that uses sound  waves to create images of your heart. It provides your doctor with information about the size and shape of your heart and how well your heart's chambers and valves  are working. This procedure takes approximately one hour. There are no restrictions for this procedure. Please do NOT wear cologne, perfume, aftershave, or lotions (deodorant is allowed). Please arrive 15 minutes prior to your appointment time.    Follow-Up: At [facility], you and your health needs are our priority.  As part of our continuing mission to provide you with exceptional heart care, we have created designated Provider Care Teams.  These Care Teams include your primary Cardiologist (physician) and Advanced Practice Providers (APPs -  Physician Assistants and Nurse Practitioners) who all work together to provide you with the care you need, when you need it.  We recommend signing up for the patient portal called "MyChart".  Sign up information is provided on this After Visit Summary.  MyChart is used to connect with patients for Virtual Visits (Telemedicine).  Patients are able to view lab/test results, encounter notes, upcoming appointments, etc.  Non-urgent messages can be sent to your provider as well.   To learn more about what you can do with MyChart, go to [email or web address].    Your next appointment:   1 year(s)  Provider:   Riley Lam, MD       Signed, Christell Constant, MD  03/08/2023 1:20 PM    [facility]

## 2023-03-08 NOTE — Patient Instructions (Signed)
Medication Instructions:  Your physician recommends that you continue on your current medications as directed. Please refer to the Current Medication list given to you today.  *If you need a refill on your cardiac medications before your next appointment, please call your pharmacy*   Lab Work: TODAY: LDL Direct  If you have labs (blood work) drawn today and your tests are completely normal, you will receive your results only by: MyChart Message (if you have MyChart) OR A paper copy in the mail If you have any lab test that is abnormal or we need to change your treatment, we will call you to review the results.   Testing/Procedures: APRIL 2025: Your physician has requested that you have an echocardiogram. Echocardiography is a painless test that uses sound waves to create images of your heart. It provides your doctor with information about the size and shape of your heart and how well your heart's chambers and valves are working. This procedure takes approximately one hour. There are no restrictions for this procedure. Please do NOT wear cologne, perfume, aftershave, or lotions (deodorant is allowed). Please arrive 15 minutes prior to your appointment time.    Follow-Up: At [facility], you and your health needs are our priority.  As part of our continuing mission to provide you with exceptional heart care, we have created designated Provider Care Teams.  These Care Teams include your primary Cardiologist (physician) and Advanced Practice Providers (APPs -  Physician Assistants and Nurse Practitioners) who all work together to provide you with the care you need, when you need it.  We recommend signing up for the patient portal called "MyChart".  Sign up information is provided on this After Visit Summary.  MyChart is used to connect with patients for Virtual Visits (Telemedicine).  Patients are able to view lab/test results, encounter notes, upcoming appointments, etc.  Non-urgent  messages can be sent to your provider as well.   To learn more about what you can do with MyChart, go to [email or web address].    Your next appointment:   1 year(s)  Provider:   Riley Lam, MD

## 2023-03-09 LAB — LDL CHOLESTEROL, DIRECT: LDL Direct: 113 mg/dL — ABNORMAL HIGH (ref 0–99)

## 2023-03-13 ENCOUNTER — Encounter: Payer: Self-pay | Admitting: Internal Medicine

## 2023-03-13 DIAGNOSIS — I700 Atherosclerosis of aorta: Secondary | ICD-10-CM

## 2023-03-13 DIAGNOSIS — E782 Mixed hyperlipidemia: Secondary | ICD-10-CM

## 2023-03-15 MED ORDER — ROSUVASTATIN CALCIUM 5 MG PO TABS: 5.0000 mg | ORAL_TABLET | Freq: Every day | ORAL | 3 refills | Status: AC

## 2023-04-23 ENCOUNTER — Encounter: Payer: Self-pay | Admitting: Internal Medicine

## 2023-04-23 NOTE — Telephone Encounter (Signed)
Please see my chart pt sent in on 5/24 regarding f/u labs.

## 2023-04-27 ENCOUNTER — Ambulatory Visit (INDEPENDENT_AMBULATORY_CARE_PROVIDER_SITE_OTHER): Payer: PRIVATE HEALTH INSURANCE

## 2023-04-27 DIAGNOSIS — M7741 Metatarsalgia, right foot: Secondary | ICD-10-CM

## 2023-04-27 DIAGNOSIS — M7742 Metatarsalgia, left foot: Secondary | ICD-10-CM

## 2023-04-27 NOTE — Progress Notes (Signed)
Patient presents today to be casted for custom molded orthotics. PATEL is the treating physician.  Impression scan cast was taken. ABN signed.  Patient info-  Shoe size: 9.5  Height: 5FT 11IN  Weight: 197  Insurance: [employer]   Patient will be notified once orthotics arrive in office and reappoint for fitting at that time.

## 2023-04-28 ENCOUNTER — Encounter: Payer: Self-pay | Admitting: Family Medicine

## 2023-04-28 ENCOUNTER — Other Ambulatory Visit: Payer: Self-pay | Admitting: Family Medicine

## 2023-04-28 DIAGNOSIS — R1900 Intra-abdominal and pelvic swelling, mass and lump, unspecified site: Secondary | ICD-10-CM

## 2023-04-30 ENCOUNTER — Other Ambulatory Visit (HOSPITAL_COMMUNITY): Payer: Self-pay

## 2023-05-04 ENCOUNTER — Ambulatory Visit
Admission: RE | Admit: 2023-05-04 | Discharge: 2023-05-04 | Disposition: A | Discharge status: Home / Self Care | Payer: PRIVATE HEALTH INSURANCE | Source: Ambulatory Visit | Attending: Family Medicine | Admitting: Family Medicine

## 2023-05-04 DIAGNOSIS — R1900 Intra-abdominal and pelvic swelling, mass and lump, unspecified site: Secondary | ICD-10-CM

## 2023-05-05 DIAGNOSIS — M25512 Pain in left shoulder: Secondary | ICD-10-CM | POA: Diagnosis not present

## 2023-05-18 ENCOUNTER — Encounter: Payer: Self-pay | Admitting: Internal Medicine

## 2023-05-20 ENCOUNTER — Ambulatory Visit (INDEPENDENT_AMBULATORY_CARE_PROVIDER_SITE_OTHER): Payer: PRIVATE HEALTH INSURANCE | Admitting: Podiatry

## 2023-05-20 DIAGNOSIS — M7741 Metatarsalgia, right foot: Secondary | ICD-10-CM

## 2023-05-20 DIAGNOSIS — M7742 Metatarsalgia, left foot: Secondary | ICD-10-CM

## 2023-05-20 NOTE — Progress Notes (Signed)
Patient presents today to pick up custom orthotics   Patient was dispensed 1 pair of custom orthotics  Fit was satisfactory. Instructions for break-in and wear was reviewed and a copy was given to the patient.    

## 2023-07-22 ENCOUNTER — Ambulatory Visit: Payer: PRIVATE HEALTH INSURANCE

## 2023-07-22 NOTE — Progress Notes (Signed)
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 Allena Katz.   Orthotics were dispensed and fit was satisfactory. Reviewed instructions for break-in and wear. Written instructions given to patient.  Patient will follow up as needed.  Addison Bailey Cped, CFo, CFm

## 2023-07-29 DIAGNOSIS — M25551 Pain in right hip: Secondary | ICD-10-CM | POA: Diagnosis not present

## 2023-08-16 DIAGNOSIS — R7401 Elevation of levels of liver transaminase levels: Secondary | ICD-10-CM | POA: Diagnosis not present

## 2023-08-18 DIAGNOSIS — L814 Other melanin hyperpigmentation: Secondary | ICD-10-CM | POA: Diagnosis not present

## 2023-08-18 DIAGNOSIS — D1801 Hemangioma of skin and subcutaneous tissue: Secondary | ICD-10-CM | POA: Diagnosis not present

## 2023-08-18 DIAGNOSIS — L578 Other skin changes due to chronic exposure to nonionizing radiation: Secondary | ICD-10-CM | POA: Diagnosis not present

## 2023-08-18 DIAGNOSIS — L821 Other seborrheic keratosis: Secondary | ICD-10-CM | POA: Diagnosis not present

## 2023-08-18 DIAGNOSIS — D229 Melanocytic nevi, unspecified: Secondary | ICD-10-CM | POA: Diagnosis not present

## 2023-08-19 DIAGNOSIS — M7071 Other bursitis of hip, right hip: Secondary | ICD-10-CM | POA: Diagnosis not present

## 2023-08-19 DIAGNOSIS — M7072 Other bursitis of hip, left hip: Secondary | ICD-10-CM | POA: Diagnosis not present

## 2023-09-20 DIAGNOSIS — M7072 Other bursitis of hip, left hip: Secondary | ICD-10-CM | POA: Diagnosis not present

## 2023-09-20 DIAGNOSIS — M7071 Other bursitis of hip, right hip: Secondary | ICD-10-CM | POA: Diagnosis not present

## 2023-11-10 DIAGNOSIS — I77810 Thoracic aortic ectasia: Secondary | ICD-10-CM | POA: Diagnosis not present

## 2023-11-10 DIAGNOSIS — K219 Gastro-esophageal reflux disease without esophagitis: Secondary | ICD-10-CM | POA: Diagnosis not present

## 2023-11-10 DIAGNOSIS — I10 Essential (primary) hypertension: Secondary | ICD-10-CM | POA: Diagnosis not present

## 2023-11-10 DIAGNOSIS — I700 Atherosclerosis of aorta: Secondary | ICD-10-CM | POA: Diagnosis not present

## 2023-11-25 DIAGNOSIS — M25562 Pain in left knee: Secondary | ICD-10-CM | POA: Diagnosis not present

## 2023-11-25 DIAGNOSIS — I700 Atherosclerosis of aorta: Secondary | ICD-10-CM | POA: Diagnosis not present

## 2023-11-26 DIAGNOSIS — R69 Illness, unspecified: Secondary | ICD-10-CM | POA: Diagnosis not present

## 2024-01-10 ENCOUNTER — Other Ambulatory Visit: Payer: Self-pay

## 2024-01-10 DIAGNOSIS — S83412A Sprain of medial collateral ligament of left knee, initial encounter: Secondary | ICD-10-CM | POA: Diagnosis not present

## 2024-01-26 DIAGNOSIS — M25562 Pain in left knee: Secondary | ICD-10-CM | POA: Diagnosis not present

## 2024-01-26 DIAGNOSIS — M25662 Stiffness of left knee, not elsewhere classified: Secondary | ICD-10-CM | POA: Diagnosis not present

## 2024-01-28 DIAGNOSIS — M25562 Pain in left knee: Secondary | ICD-10-CM | POA: Diagnosis not present

## 2024-01-28 DIAGNOSIS — M25662 Stiffness of left knee, not elsewhere classified: Secondary | ICD-10-CM | POA: Diagnosis not present

## 2024-02-01 DIAGNOSIS — M25662 Stiffness of left knee, not elsewhere classified: Secondary | ICD-10-CM | POA: Diagnosis not present

## 2024-02-01 DIAGNOSIS — M25562 Pain in left knee: Secondary | ICD-10-CM | POA: Diagnosis not present

## 2024-02-04 DIAGNOSIS — M25662 Stiffness of left knee, not elsewhere classified: Secondary | ICD-10-CM | POA: Diagnosis not present

## 2024-02-04 DIAGNOSIS — M25562 Pain in left knee: Secondary | ICD-10-CM | POA: Diagnosis not present

## 2024-02-08 DIAGNOSIS — M25562 Pain in left knee: Secondary | ICD-10-CM | POA: Diagnosis not present

## 2024-02-08 DIAGNOSIS — M25662 Stiffness of left knee, not elsewhere classified: Secondary | ICD-10-CM | POA: Diagnosis not present

## 2024-02-10 DIAGNOSIS — M25562 Pain in left knee: Secondary | ICD-10-CM | POA: Diagnosis not present

## 2024-02-10 DIAGNOSIS — M25662 Stiffness of left knee, not elsewhere classified: Secondary | ICD-10-CM | POA: Diagnosis not present

## 2024-02-15 DIAGNOSIS — M25662 Stiffness of left knee, not elsewhere classified: Secondary | ICD-10-CM | POA: Diagnosis not present

## 2024-02-15 DIAGNOSIS — M25562 Pain in left knee: Secondary | ICD-10-CM | POA: Diagnosis not present

## 2024-02-17 DIAGNOSIS — M25562 Pain in left knee: Secondary | ICD-10-CM | POA: Diagnosis not present

## 2024-02-17 DIAGNOSIS — M25662 Stiffness of left knee, not elsewhere classified: Secondary | ICD-10-CM | POA: Diagnosis not present

## 2024-02-22 ENCOUNTER — Other Ambulatory Visit: Payer: Self-pay

## 2024-02-22 DIAGNOSIS — I700 Atherosclerosis of aorta: Secondary | ICD-10-CM

## 2024-02-22 DIAGNOSIS — M25662 Stiffness of left knee, not elsewhere classified: Secondary | ICD-10-CM | POA: Diagnosis not present

## 2024-02-22 DIAGNOSIS — I493 Ventricular premature depolarization: Secondary | ICD-10-CM

## 2024-02-22 DIAGNOSIS — I77819 Aortic ectasia, unspecified site: Secondary | ICD-10-CM

## 2024-02-22 DIAGNOSIS — M25562 Pain in left knee: Secondary | ICD-10-CM | POA: Diagnosis not present

## 2024-02-22 NOTE — Progress Notes (Signed)
 Placed an order for Echo previous order d/t expire prior to echo appointment date.

## 2024-02-25 DIAGNOSIS — M25562 Pain in left knee: Secondary | ICD-10-CM | POA: Diagnosis not present

## 2024-02-25 DIAGNOSIS — M25662 Stiffness of left knee, not elsewhere classified: Secondary | ICD-10-CM | POA: Diagnosis not present

## 2024-02-29 DIAGNOSIS — M25662 Stiffness of left knee, not elsewhere classified: Secondary | ICD-10-CM | POA: Diagnosis not present

## 2024-02-29 DIAGNOSIS — M25562 Pain in left knee: Secondary | ICD-10-CM | POA: Diagnosis not present

## 2024-03-01 ENCOUNTER — Telehealth: Payer: Self-pay | Admitting: Internal Medicine

## 2024-03-01 NOTE — Telephone Encounter (Signed)
 Called and spoke with patient about his concern for OOP cost for his ECHO on 4/22. I advised patient per the billing team that he needed to call his insurance with the CPT codes 40981 and [phone] for an accurate estimate of this cost. Patient is aware and understood.

## 2024-03-02 DIAGNOSIS — M25662 Stiffness of left knee, not elsewhere classified: Secondary | ICD-10-CM | POA: Diagnosis not present

## 2024-03-02 DIAGNOSIS — M25562 Pain in left knee: Secondary | ICD-10-CM | POA: Diagnosis not present

## 2024-03-06 DIAGNOSIS — M25562 Pain in left knee: Secondary | ICD-10-CM | POA: Diagnosis not present

## 2024-03-06 DIAGNOSIS — M25662 Stiffness of left knee, not elsewhere classified: Secondary | ICD-10-CM | POA: Diagnosis not present

## 2024-03-09 DIAGNOSIS — M25562 Pain in left knee: Secondary | ICD-10-CM | POA: Diagnosis not present

## 2024-03-09 DIAGNOSIS — M25662 Stiffness of left knee, not elsewhere classified: Secondary | ICD-10-CM | POA: Diagnosis not present

## 2024-03-10 DIAGNOSIS — R1013 Epigastric pain: Secondary | ICD-10-CM | POA: Diagnosis not present

## 2024-03-13 DIAGNOSIS — R12 Heartburn: Secondary | ICD-10-CM | POA: Diagnosis not present

## 2024-03-13 DIAGNOSIS — K2270 Barrett's esophagus without dysplasia: Secondary | ICD-10-CM | POA: Diagnosis not present

## 2024-03-13 DIAGNOSIS — R1013 Epigastric pain: Secondary | ICD-10-CM | POA: Diagnosis not present

## 2024-03-15 DIAGNOSIS — R1013 Epigastric pain: Secondary | ICD-10-CM | POA: Diagnosis not present

## 2024-03-16 DIAGNOSIS — M25562 Pain in left knee: Secondary | ICD-10-CM | POA: Diagnosis not present

## 2024-03-16 DIAGNOSIS — M25662 Stiffness of left knee, not elsewhere classified: Secondary | ICD-10-CM | POA: Diagnosis not present

## 2024-03-20 DIAGNOSIS — M25662 Stiffness of left knee, not elsewhere classified: Secondary | ICD-10-CM | POA: Diagnosis not present

## 2024-03-20 DIAGNOSIS — M25562 Pain in left knee: Secondary | ICD-10-CM | POA: Diagnosis not present

## 2024-03-21 ENCOUNTER — Ambulatory Visit (HOSPITAL_COMMUNITY): Payer: PRIVATE HEALTH INSURANCE | Attending: Cardiovascular Disease

## 2024-03-21 DIAGNOSIS — I493 Ventricular premature depolarization: Secondary | ICD-10-CM | POA: Diagnosis not present

## 2024-03-21 DIAGNOSIS — I700 Atherosclerosis of aorta: Secondary | ICD-10-CM | POA: Diagnosis not present

## 2024-03-21 DIAGNOSIS — I77819 Aortic ectasia, unspecified site: Secondary | ICD-10-CM | POA: Insufficient documentation

## 2024-03-21 LAB — ECHOCARDIOGRAM COMPLETE
Area-P 1/2: 2.67 cm2
P 1/2 time: 276 ms
S' Lateral: 3.4 cm

## 2024-03-22 ENCOUNTER — Encounter: Payer: Self-pay | Admitting: Internal Medicine

## 2024-03-23 DIAGNOSIS — K219 Gastro-esophageal reflux disease without esophagitis: Secondary | ICD-10-CM | POA: Diagnosis not present

## 2024-03-23 DIAGNOSIS — R1013 Epigastric pain: Secondary | ICD-10-CM | POA: Diagnosis not present

## 2024-03-24 DIAGNOSIS — M25662 Stiffness of left knee, not elsewhere classified: Secondary | ICD-10-CM | POA: Diagnosis not present

## 2024-03-24 DIAGNOSIS — M25562 Pain in left knee: Secondary | ICD-10-CM | POA: Diagnosis not present

## 2024-03-27 ENCOUNTER — Ambulatory Visit: Payer: PRIVATE HEALTH INSURANCE | Attending: Internal Medicine | Admitting: Internal Medicine

## 2024-03-27 ENCOUNTER — Encounter: Payer: Self-pay | Admitting: Internal Medicine

## 2024-03-27 VITALS — BP 124/84 | HR 65 | Ht 71.0 in | Wt 193.0 lb

## 2024-03-27 DIAGNOSIS — R072 Precordial pain: Secondary | ICD-10-CM | POA: Diagnosis not present

## 2024-03-27 DIAGNOSIS — I700 Atherosclerosis of aorta: Secondary | ICD-10-CM

## 2024-03-27 DIAGNOSIS — M25662 Stiffness of left knee, not elsewhere classified: Secondary | ICD-10-CM | POA: Diagnosis not present

## 2024-03-27 DIAGNOSIS — I493 Ventricular premature depolarization: Secondary | ICD-10-CM

## 2024-03-27 DIAGNOSIS — I10 Essential (primary) hypertension: Secondary | ICD-10-CM | POA: Diagnosis not present

## 2024-03-27 DIAGNOSIS — I77819 Aortic ectasia, unspecified site: Secondary | ICD-10-CM

## 2024-03-27 DIAGNOSIS — E782 Mixed hyperlipidemia: Secondary | ICD-10-CM

## 2024-03-27 DIAGNOSIS — M25562 Pain in left knee: Secondary | ICD-10-CM | POA: Diagnosis not present

## 2024-03-27 MED ORDER — AMLODIPINE BESYLATE 2.5 MG PO TABS: 2.5000 mg | ORAL_TABLET | Freq: Every day | ORAL | 3 refills | Status: AC

## 2024-03-27 NOTE — Progress Notes (Signed)
 Cardiology Office Note:    Date:  03/27/2024   ID:  Kevin Travis, DOB 07/21/1956, MRN [id number]  PCP:  Lanae Pinal, MD   [facility] Providers Cardiologist:  Jann Melody, MD     Referring MD: Lanae Pinal, MD   CC: Mild aortic dilation   History of Present Illness:    Kevin Travis is a 68 y.o. male with a hx of HTN, PVCs,  and Barrett's Esophagus. 2023: found to have Mild Aortic Dilation; negative stress test. Saw NP- still having reflux.  Swimming 30 pool lengths 2024: Mild aortic dilation.  Kevin Travis is a 68 year old male with hypertension, PVCs, and mild aortic dilation who presents for routine cardiovascular follow-up.  He has a history of hypertension, premature ventricular contractions (PVCs), and mild aortic dilation. His blood pressure is well controlled, and he has been asymptomatic of PVCs for some time. He swims regularly, completing 30 pool lengths, but had to taper off for the last two to three months due to 'swimmer's knee' and subsequent rehabilitation. He has recently resumed light swimming for therapy.  He underwent an echocardiogram recently, which showed mild stable aortic dilation without significant left ventricular hypertrophy (LVH) and normal function. He has a mildly dilated aorta and a leaky heart valve, but no significant changes have been noted over time. No family history of aortic dissection, coarctation, or sudden cardiac death.  He experiences episodes of blurry vision and lightheadedness after physical exertion, such as mowing the lawn or lifting heavy objects. During these episodes, his blood pressure remains normal.  He is currently taking amlodipine (Norvasc) for blood pressure management. He has noticed the development of varicose veins since starting this medication, which he did not experience with previous medications. No chest pain, breathing issues, or palpitations.   Past Medical History:  Diagnosis  Date   Hypertension     Past Surgical History:  Procedure Laterality Date   BACK SURGERY     LEG SURGERY     VASCULAR SURGERY      Current Medications: Current Meds  Medication Sig   amLODipine (NORVASC) 2.5 MG tablet Take 1 tablet (2.5 mg total) by mouth daily.   Cholecalciferol (VITAMIN D3) 25 MCG (1000 UT) CAPS Take by mouth daily.   esomeprazole (NEXIUM) 20 MG capsule Take 20 mg by mouth in the morning and at bedtime.   famotidine  (PEPCID ) 20 MG tablet Take 20 mg by mouth 2 (two) times daily.   melatonin 3 MG TABS tablet Take 6 mg by mouth as needed (insomnia).   Multiple Vitamin (MULTIVITAMIN WITH MINERALS) TABS tablet Take 1 tablet by mouth daily.   rosuvastatin  (CRESTOR ) 5 MG tablet Take 1 tablet (5 mg total) by mouth daily.   sucralfate (CARAFATE) 1 g tablet Take 1 g by mouth 3 (three) times daily.   [DISCONTINUED] amLODipine (NORVASC) 5 MG tablet Take 5 mg by mouth daily.   [DISCONTINUED] Probiotic Product ([employer] PROBIOTIC PO) Take 500 m by mouth daily.     Allergies:   Atenolol and Sulfa antibiotics   Social History   Socioeconomic History   Marital status: Divorced    Spouse name: Not on file   Number of children: Not on file   Years of education: Not on file   Highest education level: Not on file  Occupational History   Not on file  Tobacco Use   Smoking status: Never   Smokeless tobacco: Never  Substance and Sexual Activity  Alcohol use: Not Currently    Comment: 12 beers/year, 4 oz. red wine at dinner   Drug use: No   Sexual activity: Not Currently    Partners: Female  Other Topics Concern   Not on file  Social History Narrative   Right handed   Lives in single story home alone   Social Drivers of Health   Financial Resource Strain: Low Risk  (10/27/2022)   Received from [facility], [facility]   Overall Financial Resource Strain (CARDIA)    Difficulty of Paying Living Expenses: Not very hard  Food Insecurity: No Food Insecurity  (10/27/2022)   Received from [facility], [facility]   Hunger Vital Sign    Worried About Running Out of Food in the Last Year: Never true    Ran Out of Food in the Last Year: Never true  Transportation Needs: No Transportation Needs (10/07/2021)   Received from [facility], [facility]   PRAPARE - Transportation    Lack of Transportation (Medical): No    Lack of Transportation (Non-Medical): No  Physical Activity: Insufficiently Active (10/27/2022)   Received from [facility], [facility]   Exercise Vital Sign    Days of Exercise per Week: 3 days    Minutes of Exercise per Session: 40 min  Stress: No Stress Concern Present (10/27/2022)   Received from [facility], [facility] - Occupational Stress Questionnaire    Feeling of Stress : Only a little  Social Connections: Moderately Integrated (10/27/2022)   Received from [facility], [facility]   Social Network    How would you rate your social network (family, work, friends)?: Adequate participation with social networks     Family History: The patient's family history includes Dementia in his father and mother; Diabetes in his father; Heart disease in his mother; Kidney cancer in his father.  ROS:   Please see the history of present illness.      EKGs/Labs/Other Studies Reviewed:    The following studies were reviewed today:  EKG: 03/08/23: SR LVH  Cardiac Studies & Procedures   ______________________________________________________________________________________________   STRESS TESTS  MYOCARDIAL PERFUSION IMAGING 03/03/2022  Narrative   The study is normal. The study is low risk.   No ST deviation was noted.   LV perfusion is normal. There is no evidence of ischemia. There is no evidence of infarction.   Left ventricular function is normal. Nuclear stress EF: 62 %. The left ventricular ejection fraction is normal (55-65%). End diastolic cavity size is  normal. End systolic cavity size is normal.   Prior study not available for comparison.  Normal resting and stress perfusion. No ischemia or infarction EF 65%  Normal ETT response to exercise with ICRBBB and rare PVC;s With exercise   ECHOCARDIOGRAM  ECHOCARDIOGRAM COMPLETE 03/21/2024  Narrative ECHOCARDIOGRAM REPORT    Patient Name:   Kevin Travis [facility] Date of Exam: 03/21/2024 Medical Rec #:  [id number]        Height:       71.0 in Accession #:    [id number]       Weight:       205.6 lb Date of Birth:  Sep 05, 1956        BSA:          2.133 m Patient Age:    67 years         BP:           136/91 mmHg Patient Gender:  M                HR:           54 bpm. Exam Location:  [address]  Procedure: 2D Echo, 3D Echo, Strain Analysis, Cardiac Doppler and Color Doppler (Both Spectral and Color Flow Doppler were utilized during procedure).  Indications:    I77.819 Aortic dilatation  History:        Patient has prior history of Echocardiogram examinations, most recent 03/02/2023. Arrythmias:PVC; Risk Factors:Hypertension.  Sonographer:    Juventino Oppenheim RCS Referring Phys: [id number] Joleene Burnham A Timonthy Hovater  IMPRESSIONS   1. Left ventricular ejection fraction, by estimation, is 60 to 65%. Left ventricular ejection fraction by 3D volume is 65 %. The left ventricle has normal function. The left ventricle has no regional wall motion abnormalities. Left ventricular diastolic parameters are consistent with Grade I diastolic dysfunction (impaired relaxation). The average left ventricular global longitudinal strain is -25.0 %. 2. Right ventricular systolic function is normal. The right ventricular size is normal. There is normal pulmonary artery systolic pressure. 3. Right atrial size was mildly dilated. 4. The mitral valve is normal in structure. Trivial mitral valve regurgitation. No evidence of mitral stenosis. 5. The aortic valve is tricuspid. Aortic valve regurgitation is trivial. No aortic  stenosis is present. 6. Aortic dilatation noted. There is borderline dilatation of the aortic root, measuring 39 mm. There is mild dilatation of the ascending aorta, measuring 42 mm.  FINDINGS Left Ventricle: Left ventricular ejection fraction, by estimation, is 60 to 65%. Left ventricular ejection fraction by 3D volume is 65 %. The left ventricle has normal function. The left ventricle has no regional wall motion abnormalities. The average left ventricular global longitudinal strain is -25.0 %. The left ventricular internal cavity size was normal in size. There is no left ventricular hypertrophy. Left ventricular diastolic parameters are consistent with Grade I diastolic dysfunction (impaired relaxation). Normal left ventricular filling pressure.  Right Ventricle: The right ventricular size is normal. No increase in right ventricular wall thickness. Right ventricular systolic function is normal. There is normal pulmonary artery systolic pressure. The tricuspid regurgitant velocity is 2.41 m/s, and with an assumed right atrial pressure of 3 mmHg, the estimated right ventricular systolic pressure is 26.2 mmHg.  Left Atrium: Left atrial size was normal in size.  Right Atrium: Right atrial size was mildly dilated.  Pericardium: There is no evidence of pericardial effusion.  Mitral Valve: The mitral valve is normal in structure. Trivial mitral valve regurgitation. No evidence of mitral valve stenosis.  Tricuspid Valve: The tricuspid valve is normal in structure. Tricuspid valve regurgitation is trivial.  Aortic Valve: The aortic valve is tricuspid. Aortic valve regurgitation is trivial. Aortic regurgitation PHT measures 276 msec. No aortic stenosis is present.  Pulmonic Valve: The pulmonic valve was grossly normal. Pulmonic valve regurgitation is not visualized.  Aorta: Aortic dilatation noted. There is borderline dilatation of the aortic root, measuring 39 mm. There is mild dilatation of the  ascending aorta, measuring 42 mm.  IAS/Shunts: No atrial level shunt detected by color flow Doppler.   LEFT VENTRICLE PLAX 2D LVIDd:         4.90 cm         Diastology LVIDs:         3.40 cm         LV e' medial:    7.18 cm/s LV PW:         0.80 cm  LV E/e' medial:  10.1 LV IVS:        1.10 cm         LV e' lateral:   10.80 cm/s LVOT diam:     1.80 cm         LV E/e' lateral: 6.7 LV SV:         76 LV SV Index:   36              2D Longitudinal LVOT Area:     2.54 cm        Strain 2D Strain GLS   -22.2 % (A4C): 2D Strain GLS   -26.2 % (A3C): 2D Strain GLS   -26.6 % (A2C): 2D Strain GLS   -25.0 % Avg:  3D Volume EF LV 3D EF:    Left ventricul ar ejection fraction by 3D volume is 65 %.  3D Volume EF: 3D EF:        65 % LV EDV:       117 ml LV ESV:       41 ml LV SV:        76 ml  RIGHT VENTRICLE RV Basal diam:  3.50 cm RV S prime:     14.10 cm/s TAPSE (M-mode): 2.4 cm RVSP:           26.2 mmHg  LEFT ATRIUM             Index        RIGHT ATRIUM           Index LA diam:        2.70 cm 1.27 cm/m   RA Pressure: 3.00 mmHg LA Vol (A2C):   47.4 ml 22.22 ml/m  RA Area:     22.80 cm LA Vol (A4C):   43.0 ml 20.16 ml/m  RA Volume:   77.50 ml  36.33 ml/m LA Biplane Vol: 46.4 ml 21.75 ml/m AORTIC VALVE LVOT Vmax:   136.00 cm/s LVOT Vmean:  84.400 cm/s LVOT VTI:    0.299 m AI PHT:      276 msec  AORTA Ao Root diam: 3.86 cm Ao Asc diam:  4.20 cm  MITRAL VALVE               TRICUSPID VALVE MV Area (PHT):             TR Peak grad:   23.2 mmHg MV Decel Time:             TR Vmax:        241.00 cm/s MV E velocity: 72.80 cm/s  Estimated RAP:  3.00 mmHg MV A velocity: 89.10 cm/s  RVSP:           26.2 mmHg MV E/A ratio:  0.82 SHUNTS Systemic VTI:  0.30 m Systemic Diam: 1.80 cm  Kevin Paget Croitoru MD Electronically signed by Luana Rumple MD Signature Date/Time: 03/21/2024/1:38:19 PM    Final           ______________________________________________________________________________________________       Recent Labs: No results found for requested labs within last 365 days.  Recent Lipid Panel    Component Value Date/Time   LDLDIRECT 113 (H) 03/08/2023 1309        Physical Exam:    VS:  BP 124/84 (BP Location: Left Arm)   Pulse 65   Ht 5\' 11"  (1.803 m)   Wt 87.5 kg   SpO2 98%   BMI 26.92 kg/m     Wt  Readings from Last 3 Encounters:  03/27/24 87.5 kg  03/08/23 93.3 kg  07/27/22 91.6 kg    Gen: No distress   Neck: No JVD Ears: Bilateral Samuel Crock Sign Cardiac: No Rubs or Gallops, Soft diastolic murmur, regular rhythm, +2 radial pulses Respiratory: Clear to auscultation bilaterally, normal effort, normal  respiratory rate GI: Soft, nontender, non-distended  MS: No  edema Varicose veins noted bilaterally;  moves all extremities Integument: Skin feels warm Neuro:  At time of evaluation, alert and oriented to person/place/time/situation  Psych: Normal affect, patient feels well   ASSESSMENT:    1. Aortic atherosclerosis (HCC)   2. Aortic dilatation (HCC)   3. PVC (premature ventricular contraction)   4. Mixed hyperlipidemia   5. Essential hypertension   6. Precordial pain      PLAN:    Hypertension Hypertension is well-controlled with current medication regimen. Blood pressure remains within normal limits. Occasional orthostatic symptoms, possibly related to medication, and potential venous stasis issues with amlodipine were discussed. - Reduce amlodipine dose to 2.5 mg and monitor blood pressure response. - Instruct him to report any significant increase in blood pressure or symptoms.  Varicose veins Varicose veins potentially exacerbated by amlodipine. Venous stasis issues noted with current medication.  While I have no evidence his norvasc is the cause, we will trial a lower dose given his concerns.  Mild aortic dilation Mild aortic dilation observed on  echocardiogram with no significant change in size or high-risk features. Asymptomatic with no history of aortic dissection or coarctation. Discussed spacing echocardiograms to every two years due to stable condition. - Repeat echocardiogram in two years unless new symptoms arise.  Premature ventricular contractions (PVCs) Asymptomatic of PVCs with no recent episodes of palpitations or arrhythmias reported.  None on EKG today  Two years with me      Medication Adjustments/Labs and Tests Ordered: Current medicines are reviewed at length with the patient today.  Concerns regarding medicines are outlined above.  Orders Placed This Encounter  Procedures   EKG 12-Lead   Meds ordered this encounter  Medications   amLODipine (NORVASC) 2.5 MG tablet    Sig: Take 1 tablet (2.5 mg total) by mouth daily.    Dispense:  90 tablet    Refill:  3    Patient Instructions  Medication Instructions:  Your physician has recommended you make the following change in your medication:  DECREASE: amlodipine (Norvasc) to 2.5 mg by mouth once daily  *If you need a refill on your cardiac medications before your next appointment, please call your pharmacy*  Lab Work: NONE If you have labs (blood work) drawn today and your tests are completely normal, you will receive your results only by: MyChart Message (if you have MyChart) OR A paper copy in the mail If you have any lab test that is abnormal or we need to change your treatment, we will call you to review the results.  Testing/Procedures: IN 2 YEARS: Your physician has requested that you have an echocardiogram. Echocardiography is a painless test that uses sound waves to create images of your heart. It provides your doctor with information about the size and shape of your heart and how well your heart's chambers and valves are working. This procedure takes approximately one hour. There are no restrictions for this procedure. Please do NOT wear cologne,  perfume, aftershave, or lotions (deodorant is allowed). Please arrive 15 minutes prior to your appointment time.  Please note: We ask at that you not bring children  with you during ultrasound (echo/ vascular) testing. Due to room size and safety concerns, children are not allowed in the ultrasound rooms during exams. Our front office staff cannot provide observation of children in our lobby area while testing is being conducted. An adult accompanying a patient to their appointment will only be allowed in the ultrasound room at the discretion of the ultrasound technician under special circumstances. We apologize for any inconvenience.   Follow-Up: At [facility], you and your health needs are our priority.  As part of our continuing mission to provide you with exceptional heart care, our providers are all part of one team.  This team includes your primary Cardiologist (physician) and Advanced Practice Providers or APPs (Physician Assistants and Nurse Practitioners) who all work together to provide you with the care you need, when you need it.  Your next appointment:   2 year(s)  Provider:   Jann Melody, MD           Signed, Jann Melody, MD  03/27/2024 2:07 PM    [facility]

## 2024-03-27 NOTE — Patient Instructions (Signed)
 Medication Instructions:  Your physician has recommended you make the following change in your medication:  DECREASE: amlodipine (Norvasc) to 2.5 mg by mouth once daily  *If you need a refill on your cardiac medications before your next appointment, please call your pharmacy*  Lab Work: NONE If you have labs (blood work) drawn today and your tests are completely normal, you will receive your results only by: MyChart Message (if you have MyChart) OR A paper copy in the mail If you have any lab test that is abnormal or we need to change your treatment, we will call you to review the results.  Testing/Procedures: IN 2 YEARS: Your physician has requested that you have an echocardiogram. Echocardiography is a painless test that uses sound waves to create images of your heart. It provides your doctor with information about the size and shape of your heart and how well your heart's chambers and valves are working. This procedure takes approximately one hour. There are no restrictions for this procedure. Please do NOT wear cologne, perfume, aftershave, or lotions (deodorant is allowed). Please arrive 15 minutes prior to your appointment time.  Please note: We ask at that you not bring children with you during ultrasound (echo/ vascular) testing. Due to room size and safety concerns, children are not allowed in the ultrasound rooms during exams. Our front office staff cannot provide observation of children in our lobby area while testing is being conducted. An adult accompanying a patient to their appointment will only be allowed in the ultrasound room at the discretion of the ultrasound technician under special circumstances. We apologize for any inconvenience.   Follow-Up: At [facility], you and your health needs are our priority.  As part of our continuing mission to provide you with exceptional heart care, our providers are all part of one team.  This team includes your primary  Cardiologist (physician) and Advanced Practice Providers or APPs (Physician Assistants and Nurse Practitioners) who all work together to provide you with the care you need, when you need it.  Your next appointment:   2 year(s)  Provider:   Jann Melody, MD

## 2024-03-30 DIAGNOSIS — M25562 Pain in left knee: Secondary | ICD-10-CM | POA: Diagnosis not present

## 2024-03-30 DIAGNOSIS — M25662 Stiffness of left knee, not elsewhere classified: Secondary | ICD-10-CM | POA: Diagnosis not present

## 2024-04-03 DIAGNOSIS — M25662 Stiffness of left knee, not elsewhere classified: Secondary | ICD-10-CM | POA: Diagnosis not present

## 2024-04-03 DIAGNOSIS — M25562 Pain in left knee: Secondary | ICD-10-CM | POA: Diagnosis not present

## 2024-04-04 DIAGNOSIS — R1013 Epigastric pain: Secondary | ICD-10-CM | POA: Diagnosis not present

## 2024-04-04 DIAGNOSIS — K319 Disease of stomach and duodenum, unspecified: Secondary | ICD-10-CM | POA: Diagnosis not present

## 2024-04-04 DIAGNOSIS — K2289 Other specified disease of esophagus: Secondary | ICD-10-CM | POA: Diagnosis not present

## 2024-04-04 DIAGNOSIS — K2970 Gastritis, unspecified, without bleeding: Secondary | ICD-10-CM | POA: Diagnosis not present

## 2024-04-04 DIAGNOSIS — K219 Gastro-esophageal reflux disease without esophagitis: Secondary | ICD-10-CM | POA: Diagnosis not present

## 2024-04-04 DIAGNOSIS — K317 Polyp of stomach and duodenum: Secondary | ICD-10-CM | POA: Diagnosis not present

## 2024-04-04 DIAGNOSIS — K2270 Barrett's esophagus without dysplasia: Secondary | ICD-10-CM | POA: Diagnosis not present

## 2024-04-06 DIAGNOSIS — K2270 Barrett's esophagus without dysplasia: Secondary | ICD-10-CM | POA: Diagnosis not present

## 2024-04-06 DIAGNOSIS — M25562 Pain in left knee: Secondary | ICD-10-CM | POA: Diagnosis not present

## 2024-04-06 DIAGNOSIS — M25662 Stiffness of left knee, not elsewhere classified: Secondary | ICD-10-CM | POA: Diagnosis not present

## 2024-04-06 DIAGNOSIS — K319 Disease of stomach and duodenum, unspecified: Secondary | ICD-10-CM | POA: Diagnosis not present

## 2024-04-13 DIAGNOSIS — M25662 Stiffness of left knee, not elsewhere classified: Secondary | ICD-10-CM | POA: Diagnosis not present

## 2024-04-13 DIAGNOSIS — M25562 Pain in left knee: Secondary | ICD-10-CM | POA: Diagnosis not present

## 2024-05-17 DIAGNOSIS — Z125 Encounter for screening for malignant neoplasm of prostate: Secondary | ICD-10-CM | POA: Diagnosis not present

## 2024-05-17 DIAGNOSIS — Z136 Encounter for screening for cardiovascular disorders: Secondary | ICD-10-CM | POA: Diagnosis not present

## 2024-06-28 DIAGNOSIS — M9905 Segmental and somatic dysfunction of pelvic region: Secondary | ICD-10-CM | POA: Diagnosis not present

## 2024-06-28 DIAGNOSIS — M9902 Segmental and somatic dysfunction of thoracic region: Secondary | ICD-10-CM | POA: Diagnosis not present

## 2024-06-28 DIAGNOSIS — M62830 Muscle spasm of back: Secondary | ICD-10-CM | POA: Diagnosis not present

## 2024-06-28 DIAGNOSIS — M9903 Segmental and somatic dysfunction of lumbar region: Secondary | ICD-10-CM | POA: Diagnosis not present

## 2024-07-03 DIAGNOSIS — M9903 Segmental and somatic dysfunction of lumbar region: Secondary | ICD-10-CM | POA: Diagnosis not present

## 2024-07-03 DIAGNOSIS — M9902 Segmental and somatic dysfunction of thoracic region: Secondary | ICD-10-CM | POA: Diagnosis not present

## 2024-07-03 DIAGNOSIS — M62830 Muscle spasm of back: Secondary | ICD-10-CM | POA: Diagnosis not present

## 2024-07-03 DIAGNOSIS — M9905 Segmental and somatic dysfunction of pelvic region: Secondary | ICD-10-CM | POA: Diagnosis not present

## 2024-07-07 DIAGNOSIS — M9903 Segmental and somatic dysfunction of lumbar region: Secondary | ICD-10-CM | POA: Diagnosis not present

## 2024-07-07 DIAGNOSIS — M62830 Muscle spasm of back: Secondary | ICD-10-CM | POA: Diagnosis not present

## 2024-07-07 DIAGNOSIS — M9905 Segmental and somatic dysfunction of pelvic region: Secondary | ICD-10-CM | POA: Diagnosis not present

## 2024-07-07 DIAGNOSIS — M9902 Segmental and somatic dysfunction of thoracic region: Secondary | ICD-10-CM | POA: Diagnosis not present

## 2024-07-10 DIAGNOSIS — M9902 Segmental and somatic dysfunction of thoracic region: Secondary | ICD-10-CM | POA: Diagnosis not present

## 2024-07-10 DIAGNOSIS — M9905 Segmental and somatic dysfunction of pelvic region: Secondary | ICD-10-CM | POA: Diagnosis not present

## 2024-07-10 DIAGNOSIS — M62830 Muscle spasm of back: Secondary | ICD-10-CM | POA: Diagnosis not present

## 2024-07-10 DIAGNOSIS — M9903 Segmental and somatic dysfunction of lumbar region: Secondary | ICD-10-CM | POA: Diagnosis not present

## 2024-08-25 DIAGNOSIS — K219 Gastro-esophageal reflux disease without esophagitis: Secondary | ICD-10-CM | POA: Diagnosis not present

## 2024-08-25 DIAGNOSIS — Z860101 Personal history of adenomatous and serrated colon polyps: Secondary | ICD-10-CM | POA: Diagnosis not present

## 2024-08-25 DIAGNOSIS — K2270 Barrett's esophagus without dysplasia: Secondary | ICD-10-CM | POA: Diagnosis not present

## 2024-09-01 DIAGNOSIS — M5126 Other intervertebral disc displacement, lumbar region: Secondary | ICD-10-CM | POA: Diagnosis not present

## 2024-09-01 DIAGNOSIS — R200 Anesthesia of skin: Secondary | ICD-10-CM | POA: Diagnosis not present

## 2024-09-01 DIAGNOSIS — M439 Deforming dorsopathy, unspecified: Secondary | ICD-10-CM | POA: Diagnosis not present

## 2024-09-01 DIAGNOSIS — M47816 Spondylosis without myelopathy or radiculopathy, lumbar region: Secondary | ICD-10-CM | POA: Diagnosis not present

## 2024-09-05 DIAGNOSIS — R351 Nocturia: Secondary | ICD-10-CM | POA: Diagnosis not present

## 2024-09-05 DIAGNOSIS — R972 Elevated prostate specific antigen [PSA]: Secondary | ICD-10-CM | POA: Diagnosis not present

## 2024-09-05 DIAGNOSIS — R350 Frequency of micturition: Secondary | ICD-10-CM | POA: Diagnosis not present

## 2024-09-05 DIAGNOSIS — N401 Enlarged prostate with lower urinary tract symptoms: Secondary | ICD-10-CM | POA: Diagnosis not present

## 2024-09-06 ENCOUNTER — Other Ambulatory Visit: Payer: Self-pay | Admitting: Urology

## 2024-09-06 DIAGNOSIS — R972 Elevated prostate specific antigen [PSA]: Secondary | ICD-10-CM

## 2024-09-15 ENCOUNTER — Other Ambulatory Visit: Payer: Self-pay | Admitting: Family Medicine

## 2024-09-15 DIAGNOSIS — Z87820 Personal history of traumatic brain injury: Secondary | ICD-10-CM

## 2024-09-15 DIAGNOSIS — R2689 Other abnormalities of gait and mobility: Secondary | ICD-10-CM

## 2024-09-25 DIAGNOSIS — Z713 Dietary counseling and surveillance: Secondary | ICD-10-CM | POA: Diagnosis not present

## 2024-09-25 DIAGNOSIS — R7303 Prediabetes: Secondary | ICD-10-CM | POA: Diagnosis not present

## 2024-09-25 DIAGNOSIS — I10 Essential (primary) hypertension: Secondary | ICD-10-CM | POA: Diagnosis not present

## 2024-10-12 DIAGNOSIS — J988 Other specified respiratory disorders: Secondary | ICD-10-CM | POA: Diagnosis not present

## 2024-10-16 ENCOUNTER — Encounter: Payer: Self-pay | Admitting: Family Medicine

## 2024-10-19 ENCOUNTER — Ambulatory Visit
Admission: RE | Admit: 2024-10-19 | Discharge: 2024-10-19 | Disposition: A | Discharge status: Home / Self Care | Source: Ambulatory Visit | Attending: Family Medicine | Admitting: Family Medicine

## 2024-10-19 DIAGNOSIS — S0003XA Contusion of scalp, initial encounter: Secondary | ICD-10-CM | POA: Diagnosis not present

## 2024-10-19 DIAGNOSIS — R2689 Other abnormalities of gait and mobility: Secondary | ICD-10-CM

## 2024-10-19 DIAGNOSIS — Z87820 Personal history of traumatic brain injury: Secondary | ICD-10-CM

## 2024-10-19 MED ORDER — GADOPICLENOL 0.5 MMOL/ML IV SOLN
9.0000 mL | Freq: Once | INTRAVENOUS | Status: AC | PRN
  Administered 2024-10-19: 9 mL via INTRAVENOUS

## 2024-11-08 ENCOUNTER — Inpatient Hospital Stay: Admission: RE | Admit: 2024-11-08 | Discharge: 2024-11-08 | Attending: Urology

## 2024-11-08 DIAGNOSIS — R972 Elevated prostate specific antigen [PSA]: Secondary | ICD-10-CM | POA: Diagnosis not present

## 2024-11-08 MED ORDER — GADOPICLENOL 0.5 MMOL/ML IV SOLN
9.0000 mL | Freq: Once | INTRAVENOUS | Status: AC | PRN
  Administered 2024-11-08: 9 mL via INTRAVENOUS

## 2024-11-09 DIAGNOSIS — I10 Essential (primary) hypertension: Secondary | ICD-10-CM | POA: Diagnosis not present

## 2024-11-09 DIAGNOSIS — R7303 Prediabetes: Secondary | ICD-10-CM | POA: Diagnosis not present

## 2024-11-16 DIAGNOSIS — I10 Essential (primary) hypertension: Secondary | ICD-10-CM | POA: Diagnosis not present

## 2024-11-16 DIAGNOSIS — Z713 Dietary counseling and surveillance: Secondary | ICD-10-CM | POA: Diagnosis not present

## 2024-11-16 DIAGNOSIS — R7303 Prediabetes: Secondary | ICD-10-CM | POA: Diagnosis not present

## 2024-11-16 DIAGNOSIS — K219 Gastro-esophageal reflux disease without esophagitis: Secondary | ICD-10-CM | POA: Diagnosis not present

## 2024-11-16 DIAGNOSIS — K2270 Barrett's esophagus without dysplasia: Secondary | ICD-10-CM | POA: Diagnosis not present

## 2024-11-17 DIAGNOSIS — N4281 Prostatodynia syndrome: Secondary | ICD-10-CM | POA: Diagnosis not present

## 2024-12-01 ENCOUNTER — Other Ambulatory Visit: Payer: Self-pay
